# Patient Record
Sex: Male | Born: 1950
Health system: Southern US, Community
[De-identification: ages and names within clinical notes are randomized; demographics above are authoritative.]

## PROBLEM LIST (undated history)

## (undated) DIAGNOSIS — I2510 Atherosclerotic heart disease of native coronary artery without angina pectoris: Secondary | ICD-10-CM

## (undated) DIAGNOSIS — Z9989 Dependence on other enabling machines and devices: Secondary | ICD-10-CM

## (undated) DIAGNOSIS — G4733 Obstructive sleep apnea (adult) (pediatric): Secondary | ICD-10-CM

## (undated) DIAGNOSIS — E785 Hyperlipidemia, unspecified: Secondary | ICD-10-CM

## (undated) DIAGNOSIS — I219 Acute myocardial infarction, unspecified: Secondary | ICD-10-CM

## (undated) DIAGNOSIS — G4730 Sleep apnea, unspecified: Secondary | ICD-10-CM

## (undated) DIAGNOSIS — I252 Old myocardial infarction: Secondary | ICD-10-CM

## (undated) HISTORY — DX: Old myocardial infarction: I25.2

## (undated) HISTORY — DX: Sleep apnea, unspecified: G47.30

## (undated) HISTORY — DX: Obstructive sleep apnea (adult) (pediatric): G47.33

## (undated) HISTORY — DX: Acute myocardial infarction, unspecified: I21.9

## (undated) HISTORY — PX: ANKLE FRACTURE SURGERY: SHX122

## (undated) HISTORY — DX: Dependence on other enabling machines and devices: Z99.89

## (undated) HISTORY — DX: Atherosclerotic heart disease of native coronary artery without angina pectoris: I25.10

## (undated) HISTORY — DX: Hyperlipidemia, unspecified: E78.5

---

## 1988-11-25 HISTORY — PX: CORONARY/GRAFT ACUTE MI REVASCULARIZATION: CATH118305

## 1996-11-25 DIAGNOSIS — I2510 Atherosclerotic heart disease of native coronary artery without angina pectoris: Secondary | ICD-10-CM

## 1996-11-25 DIAGNOSIS — I252 Old myocardial infarction: Secondary | ICD-10-CM

## 1996-11-25 HISTORY — DX: Old myocardial infarction: I25.2

## 1996-11-25 HISTORY — DX: Atherosclerotic heart disease of native coronary artery without angina pectoris: I25.10

## 2015-03-29 ENCOUNTER — Encounter: Payer: Self-pay | Admitting: Neurology

## 2016-03-07 DIAGNOSIS — M79641 Pain in right hand: Secondary | ICD-10-CM | POA: Insufficient documentation

## 2016-03-07 DIAGNOSIS — E785 Hyperlipidemia, unspecified: Secondary | ICD-10-CM | POA: Insufficient documentation

## 2016-03-07 DIAGNOSIS — I471 Supraventricular tachycardia, unspecified: Secondary | ICD-10-CM | POA: Insufficient documentation

## 2016-03-07 DIAGNOSIS — I2510 Atherosclerotic heart disease of native coronary artery without angina pectoris: Secondary | ICD-10-CM | POA: Insufficient documentation

## 2017-03-28 DIAGNOSIS — G8929 Other chronic pain: Secondary | ICD-10-CM | POA: Insufficient documentation

## 2017-03-28 DIAGNOSIS — I252 Old myocardial infarction: Secondary | ICD-10-CM | POA: Insufficient documentation

## 2017-03-28 DIAGNOSIS — D649 Anemia, unspecified: Secondary | ICD-10-CM | POA: Insufficient documentation

## 2017-07-21 DIAGNOSIS — L2084 Intrinsic (allergic) eczema: Secondary | ICD-10-CM | POA: Insufficient documentation

## 2017-07-21 DIAGNOSIS — B360 Pityriasis versicolor: Secondary | ICD-10-CM | POA: Insufficient documentation

## 2017-09-17 HISTORY — PX: OTHER SURGICAL HISTORY: SHX169

## 2017-09-23 DIAGNOSIS — M545 Low back pain, unspecified: Secondary | ICD-10-CM | POA: Insufficient documentation

## 2018-06-29 ENCOUNTER — Ambulatory Visit: Payer: Self-pay | Admitting: Family Medicine

## 2018-07-21 ENCOUNTER — Other Ambulatory Visit: Payer: Self-pay

## 2018-07-21 ENCOUNTER — Encounter: Payer: Self-pay | Admitting: Family Medicine

## 2018-07-21 ENCOUNTER — Ambulatory Visit (INDEPENDENT_AMBULATORY_CARE_PROVIDER_SITE_OTHER): Payer: Medicare Other | Admitting: Family Medicine

## 2018-07-21 VITALS — BP 119/75 | HR 67 | Temp 98.4°F | Ht 70.5 in | Wt 325.2 lb

## 2018-07-21 DIAGNOSIS — E785 Hyperlipidemia, unspecified: Secondary | ICD-10-CM | POA: Diagnosis not present

## 2018-07-21 DIAGNOSIS — I2510 Atherosclerotic heart disease of native coronary artery without angina pectoris: Secondary | ICD-10-CM

## 2018-07-21 DIAGNOSIS — Z9989 Dependence on other enabling machines and devices: Secondary | ICD-10-CM | POA: Diagnosis not present

## 2018-07-21 DIAGNOSIS — G4733 Obstructive sleep apnea (adult) (pediatric): Secondary | ICD-10-CM | POA: Diagnosis not present

## 2018-07-21 DIAGNOSIS — M791 Myalgia, unspecified site: Secondary | ICD-10-CM

## 2018-07-21 MED ORDER — BISOPROLOL FUMARATE 5 MG PO TABS: 2.5000 mg | ORAL_TABLET | Freq: Every day | ORAL | 1 refills | Status: DC

## 2018-07-21 MED ORDER — ROSUVASTATIN CALCIUM 20 MG PO TABS: 20.0000 mg | ORAL_TABLET | Freq: Every day | ORAL | 1 refills | Status: DC

## 2018-07-21 NOTE — Progress Notes (Signed)
Subjective:  By signing my name below, I, Bruce Williams, attest that this documentation has been prepared under the direction and in the presence of Bruce Agreste, MD Electronically Signed: Ladene Artist, ED Scribe 07/21/2018 at 10:08 AM.   Patient ID: Bruce Williams, male    DOB: [birth year], 67 y.o.   MRN: [id number]  Chief Complaint  Patient presents with  . Establish Care    giving history. informed DOT will have to be done at another visit   HPI Bruce Williams is a 67 y.o. male who presents to Primary Care at [facility] to establish care. New pt to our office. H/o OSA on cpap, hyperlipidemia, CAD, MI, stent in 1998. Cardiologist Dr. Wyline Copas in [facility]. Prev PCP Dr. Jeralene Huff. Pt is fasting at this visit.  CAD Zebeta 5 mg qd. - Last saw cardiology in Oct. Pt requests a referral to a [facility] cardiologist since his DOT expires on 10/14. Pt denies h/o HTN or h/o angina.  Hyperlipidemia Crestor 20 mg qd. Recent lipid panel 06/2017 LDL 61, HDL 48, trig 63, total 121. - Pt has noticed myalgias and cramps, worsened by the end of the day. States he did not have myalgias while on simvastatin.  Pt also requests a referral to pulm or neuro for his cpap. Denies h/o COPD.  There are no active problems to display for this patient.  No past medical history on file.  No Known Allergies Prior to Admission medications   Not on File   Social History   Socioeconomic History  . Marital status: Married    Spouse name: Not on file  . Number of children: Not on file  . Years of education: Not on file  . Highest education level: Not on file  Occupational History  . Not on file  Social Needs  . Financial resource strain: Not on file  . Food insecurity:    Worry: Not on file    Inability: Not on file  . Transportation needs:    Medical: Not on file    Non-medical: Not on file  Tobacco Use  . Smoking status: Former Smoker    Last attempt to quit: 07/21/2013    Years since quitting: 5.0  .  Smokeless tobacco: Never Used  . Tobacco comment: currenty vapes (tabaco)  Substance and Sexual Activity  . Alcohol use: Not on file  . Drug use: Not on file  . Sexual activity: Not on file  Lifestyle  . Physical activity:    Days per week: Not on file    Minutes per session: Not on file  . Stress: Not on file  Relationships  . Social connections:    Talks on phone: Not on file    Gets together: Not on file    Attends religious service: Not on file    Active member of club or organization: Not on file    Attends meetings of clubs or organizations: Not on file    Relationship status: Not on file  . Intimate partner violence:    Fear of current or ex partner: Not on file    Emotionally abused: Not on file    Physically abused: Not on file    Forced sexual activity: Not on file  Other Topics Concern  . Not on file  Social History Narrative  . Not on file   Review of Systems  Cardiovascular: Negative for chest pain.  Musculoskeletal: Positive for myalgias.      Objective:   Physical Exam  Constitutional: He is oriented to person, place, and time. He appears well-developed and well-nourished.  HENT:  Head: Normocephalic and atraumatic.  Eyes: Pupils are equal, round, and reactive to light. EOM are normal.  Neck: No JVD present. Carotid bruit is not present.  Cardiovascular: Normal rate, regular rhythm and normal heart sounds.  No murmur heard. Pulmonary/Chest: Effort normal and breath sounds normal. He has no rales.  Musculoskeletal: He exhibits edema.  1+ pedal edema  Neurological: He is alert and oriented to person, place, and time.  Skin: Skin is warm and dry.  Psychiatric: He has a normal mood and affect.  Vitals reviewed.  Vitals:   07/21/18 0940  BP: 119/75  Pulse: 67  Temp: 98.4 F (36.9 C)  TempSrc: Oral  SpO2: 91%  Weight: (!) 325 lb 3.2 oz (147.5 kg)  Height: 5' 10.5" (1.791 m)      Assessment & Plan:   Bruce Williams is a 67 y.o. male Coronary  artery disease involving native heart without angina pectoris, unspecified vessel or lesion type - Plan: Ambulatory referral to Cardiology, bisoprolol (ZEBETA) 5 MG tablet, rosuvastatin (CRESTOR) 20 MG tablet  -Asymptomatic, tolerating current meds, continue follow-up with cardiology.  OSA on CPAP - Plan: Ambulatory referral to Sleep Studies  -Tolerating CPAP, will refer to sleep specialist at his request for ongoing care.  No changes for now.  Myalgia - Plan: [facility] Hyperlipidemia, unspecified hyperlipidemia type - Plan: Comprehensive metabolic panel, Lipid panel, rosuvastatin (CRESTOR) 20 MG tablet  -Currently on Crestor with some intermittent myalgias.  Will check CPK, lipid panel, CMP.  Option of co-Q10 to see if that helps of myalgias, or temporary lower dose of Crestor to see if that is better tolerated.  RTC precautions if worsening.  Plans to return for DOT physical, recommendations discussed regarding history of CAD  Meds ordered this encounter  Medications  . bisoprolol (ZEBETA) 5 MG tablet    Sig: Take 0.5 tablets (2.5 mg total) by mouth daily.    Dispense:  90 tablet    Refill:  1  . rosuvastatin (CRESTOR) 20 MG tablet    Sig: Take 1 tablet (20 mg total) by mouth daily.    Dispense:  90 tablet    Refill:  1   Patient Instructions     For DOT - I will need recent compliance report from your CPAP. Additionally you will need to be seen by cardiology. I have placed referral to cardiology and sleep specialist.   I will check lipids today and muscle enzyme test. You can ry CoQ10 to see if that helps with muscle aches or take 1/2 of the crestor to see if that is better tolerated for now.   Return to the clinic or go to the nearest emergency room if any of your symptoms worsen or new symptoms occur.   If you have lab work done today you will be contacted with your lab results within the next 2 weeks.  If you have not heard from [place] then please contact us. The fastest way to get your  results is to register for My Chart.   IF you received an x-ray today, you will receive an invoice from [facility]. Please contact [facility] at [phone] with questions or concerns regarding your invoice.   IF you received labwork today, you will receive an invoice from [facility]. Please contact [facility] at [phone] with questions or concerns regarding your invoice.   Our billing staff will not be able to assist you  with questions regarding bills from these companies.  You will be contacted with the lab results as soon as they are available. The fastest way to get your results is to activate your My Chart account. Instructions are located on the last page of this paperwork. If you have not heard from [place] regarding the results in 2 weeks, please contact this office.      I personally performed the services described in this documentation, which was scribed in my presence. The recorded information has been reviewed and considered for accuracy and completeness, addended by me as needed, and agree with information above.  Signed,   Merri Ray, MD Primary [facility] at [place].  07/25/18 3:12 PM

## 2018-07-21 NOTE — Patient Instructions (Addendum)
   For DOT - I will need recent compliance report from your CPAP. Additionally you will need to be seen by cardiology. I have placed referral to cardiology and sleep specialist.   I will check lipids today and muscle enzyme test. You can ry CoQ10 to see if that helps with muscle aches or take 1/2 of the crestor to see if that is better tolerated for now.   Return to the clinic or go to the nearest emergency room if any of your symptoms worsen or new symptoms occur.   If you have lab work done today you will be contacted with your lab results within the next 2 weeks.  If you have not heard from [place] then please contact us. The fastest way to get your results is to register for My Chart.   IF you received an x-ray today, you will receive an invoice from [facility]. Please contact [facility] at [phone] with questions or concerns regarding your invoice.   IF you received labwork today, you will receive an invoice from [facility]. Please contact [facility] at [phone] with questions or concerns regarding your invoice.   Our billing staff will not be able to assist you with questions regarding bills from these companies.  You will be contacted with the lab results as soon as they are available. The fastest way to get your results is to activate your My Chart account. Instructions are located on the last page of this paperwork. If you have not heard from [place] regarding the results in 2 weeks, please contact this office.

## 2018-07-22 LAB — LIPID PANEL
CHOLESTEROL TOTAL: 140 mg/dL (ref 100–199)
Chol/HDL Ratio: 2.2 ratio (ref 0.0–5.0)
HDL: 63 mg/dL (ref >39)
LDL CALC: 65 mg/dL (ref 0–99)
Triglycerides: 60 mg/dL (ref 0–149)
VLDL Cholesterol Cal: 12 mg/dL (ref 5–40)

## 2018-07-22 LAB — COMPREHENSIVE METABOLIC PANEL
A/G RATIO: 1.4 (ref 1.2–2.2)
ALBUMIN: 4.2 g/dL (ref 3.6–4.8)
ALK PHOS: 46 IU/L (ref 39–117)
ALT: 11 IU/L (ref 0–44)
AST: 12 IU/L (ref 0–40)
BILIRUBIN TOTAL: 0.4 mg/dL (ref 0.0–1.2)
BUN / CREAT RATIO: 13 (ref 10–24)
BUN: 13 mg/dL (ref 8–27)
CHLORIDE: 103 mmol/L (ref 96–106)
CO2: 20 mmol/L (ref 20–29)
Calcium: 9.6 mg/dL (ref 8.6–10.2)
Creatinine, Ser: 0.97 mg/dL (ref 0.76–1.27)
GFR calc Af Amer: 94 mL/min/{1.73_m2} (ref >59)
GFR calc non Af Amer: 82 mL/min/{1.73_m2} (ref >59)
GLOBULIN, TOTAL: 3.1 g/dL (ref 1.5–4.5)
Glucose: 86 mg/dL (ref 65–99)
Potassium: 4.8 mmol/L (ref 3.5–5.2)
SODIUM: 140 mmol/L (ref 134–144)
Total Protein: 7.3 g/dL (ref 6.0–8.5)

## 2018-07-22 LAB — CK: Total CK: 66 U/L (ref 24–204)

## 2018-08-04 ENCOUNTER — Encounter: Payer: Self-pay | Admitting: Neurology

## 2018-08-04 ENCOUNTER — Ambulatory Visit (INDEPENDENT_AMBULATORY_CARE_PROVIDER_SITE_OTHER): Payer: Medicare Other | Admitting: Neurology

## 2018-08-04 VITALS — BP 128/79 | HR 73 | Ht 71.0 in | Wt 329.0 lb

## 2018-08-04 DIAGNOSIS — Z6841 Body Mass Index (BMI) 40.0 and over, adult: Secondary | ICD-10-CM | POA: Diagnosis not present

## 2018-08-04 DIAGNOSIS — Z9989 Dependence on other enabling machines and devices: Secondary | ICD-10-CM | POA: Diagnosis not present

## 2018-08-04 DIAGNOSIS — E66813 Obesity, class 3: Secondary | ICD-10-CM

## 2018-08-04 DIAGNOSIS — I2510 Atherosclerotic heart disease of native coronary artery without angina pectoris: Secondary | ICD-10-CM | POA: Diagnosis not present

## 2018-08-04 DIAGNOSIS — G4733 Obstructive sleep apnea (adult) (pediatric): Secondary | ICD-10-CM | POA: Diagnosis not present

## 2018-08-04 NOTE — Progress Notes (Signed)
SLEEP MEDICINE CLINIC   Provider:  Larey Seat, M.D.   Primary Care Physician:  [name], MD   Referring Provider: [name], MD    Chief Complaint  Patient presents with  . New Patient (Initial Visit)    pt alone, rm 10. pt is a Proofreader, he is here to establish care with CPAP and get a paper that proves to DOT that he is using the machine. DME is [facility] and pt is due for supplies. sleep study 02/2015 and set up on CPAP 03/2015    HPI:  Bruce Williams is a 67 y.o. male patient, seen here in a referral from Dr. 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 He was first diagnosed in 2002 at [facility] [place] in hospital sleep lab.  He learned that he had sleep apnea and was treated with CPAP ever since.  Once he moved to [place] he continued treatment here at [facility].  He has been using an AutoSet air sense 10 machine, and this was just issued to him in May 2016.  It is set between 5 and 15 cmH2O with 3 cm EPR, he is using on average 6 hours and 35 minutes of CPAP therapy nightly, 100% compliance by download and his residual AHI is 1.8.  He does however have high air leakage, the 95th percentile pressure is close to 14 cm which would mean that he could use a little bit higher pressure settings on his AutoSet.  He feels clinically that his CPAP therapy is sufficient he has not endorsed elevated sleepiness levels on the Epworth sleepiness score.  Today he endorses at 4 points, fatigue severity at 23 points, and the geriatric depression score was endorsed at 1 out of 15 points.  DOT related 90-day download shows equally 100% of use at time 99% by time over 4 hours, only one night to use the machine for 3-1/2 hours.   Average use of time 6 hours 26 minutes, same settings as above, residual AHI 2.0 with obstructive apneas being 0.5.  Similar air leak.  Chief complaint according to patient :He needs to transfer care and wanted to be in the [facility].   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  He works about 10 to 12 hours daily in his retirement job.  He just transitioned to Medicare.  Occasional he will spend the evening at church functions, the couple goes bowling, movies goes to the movies but he is not necessarily physically active. His bedtime is usually between 10 and 11 PM unless he has a very early morning assignment.  He usually has no trouble to initiate sleep, on occasion he will be early in bed because of the early morning assignments, but he usually sleeps well in a cool, quiet and dark bedroom.  He does not have nocturia.  He sleeps on either side, mostly mostly the left. He sleeps on only one pillow for head support in a nonadjustable bed.  He rises in the morning usually at 6 AM or 7 AM, and he usually feels rested at the time.  He averages 7 to 8 hours of nocturnal  sleep, most nights uninterrupted.  I have the pleasure of reviewing Bruce Williams's last sleep study which was a home sleep test performed at [facility] on [address] in [place] on Mar 30, 2015 by Dr. at bedtime Mohammed.  The AHI was 57.6, supine sleep position accentuated the AHI to 59.3, the majority was obstructive 28.5/h followed by centrals at 14.4/h.  Oxygen saturation low was 79%, duration of 31 minutes at or below 88%.  1 hour 49 minutes.  Pulse rate between 47 and 94 bpm apparently no arrhythmia was captured.  He was issued the auto set air sense 10 machine after this test.  I also have a pulmonology note available obstructive sleep apnea ordering AutoPap 5 through 15 cmH2O, risk factor BMI 45-49.9 in adult.   Based on these notes I should be able to transfer his CPAP care and supplies to a local DME.   Sleep medical history and family sleep history: CAD, MI in 1998 and stent [place] in [place]. - reports ongoing  irregular heart beats.  OSA affected his father, another air [employer] veteran. No sleep walking, enuresis , no night terrors.   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 The patient is married, has 3 adult children ( oldest 72 year old daughter with 3 children, 2 step children) , 5 grandchildren- former 70 year smoker- quit 5 years ago in 2014. Beer / wine - one glass a week. Caffeine ; 2 cups of coffee a day- no sodas, not often iced tea.     Review of Systems: Out of a complete 14 system review, the patient complains of only the following symptoms, and all other reviewed systems are negative.   Snoring, poor sleep if not on CPAP. Weight gain.  Epworth score: 4/ 24 points , Fatigue severity score: 23/ 63 points  , depression score 1/ 15    Social History   Socioeconomic History  . Marital status: Married    Spouse name: Not on file  . Number of children: Not on file  . Years of education: Not on file  . Highest education level: Not on file  Occupational History  . Not on file  Social Needs  . Financial resource strain: Not on file  . Food insecurity:    Worry: Not on file    Inability: Not on file  . Transportation needs:    Medical: Not on file    Non-medical: Not on file  Tobacco Use  . Smoking status: Former Smoker    Last attempt to quit: 07/21/2013    Years since quitting: 5.0  . Smokeless tobacco: Never Used  . Tobacco comment: currenty vapes (tabaco)  Substance and Sexual Activity  . Alcohol use: Not on file  . Drug use: Not on file  . Sexual activity: Not on file  Lifestyle  . Physical activity:    Days per week: Not on file    Minutes per session: Not on file  . Stress: Not on file  Relationships  . Social connections:    Talks on phone:  Not on file    Gets together: Not on file    Attends religious service: Not on file    Active member of club or organization: Not on file    Attends meetings of clubs or organizations: Not on file    Relationship status: Not on file  . Intimate partner violence:    Fear of current or ex partner: Not on  file    Emotionally abused: Not on file    Physically abused: Not on file    Forced sexual activity: Not on file  Other Topics Concern  . Not on file  Social History Narrative  . Not on file    No family history on file.  No past medical history on file.  No past surgical history on file.  Current Outpatient Medications  Medication Sig Dispense Refill  . aspirin EC 81 MG tablet Take 81 mg by mouth daily.    . bisoprolol (ZEBETA) 5 MG tablet Take 0.5 tablets (2.5 mg total) by mouth daily. 90 tablet 1  . Multiple Vitamin (MULTI-VITAMINS) TABS Take by mouth.    . rosuvastatin (CRESTOR) 20 MG tablet Take 1 tablet (20 mg total) by mouth daily. 90 tablet 1  . triamcinolone cream (KENALOG) 0.1 % Apply to patches of dry skin 2 times daily as needed     No current facility-administered medications for this visit.     Allergies as of 08/04/2018  . (No Known Allergies)    Vitals: BP 128/79   Pulse 73   Ht 5\' 11"  (1.803 m)   Wt (!) 329 lb (149.2 kg)   BMI 45.89 kg/m  Last Weight:  Wt Readings from Last 1 Encounters:  08/04/18 (!) 329 lb (149.2 kg)   ZOX:WRUE mass index is 45.89 kg/m.     Last Height:   Ht Readings from Last 1 Encounters:  08/04/18 5\' 11"  (1.803 m)    Physical exam:  General: The patient is awake, alert and appears not in acute distress. The patient is well groomed. Dentures full -lower Head: Normocephalic, atraumatic. Neck is supple. Mallampati 3-  very elongated uvula.   neck circumference:20.5 . Nasal airflow patent . Retrognathia is not seen.  Cardiovascular:  Regular rate and rhythm , without  murmurs or carotid bruit, and without distended neck  veins. Respiratory: Lungs are clear to auscultation. Skin:  Without evidence of edema, or rash Trunk: BMI is 46. The patient's posture is erect.   Neurologic exam : The patient is awake and alert, oriented to place and time.   Attention span & concentration ability appears normal.  Speech is fluent,  without  dysarthria, dysphonia or aphasia.  Mood and affect are appropriate.  Cranial nerves: Intact taste and smell- Pupils are equal and briskly reactive to light. Funduscopic exam without evidence of pallor or edema, early cataracts forming .  Extraocular movements  in vertical and horizontal planes intact and without nystagmus. Visual fields by finger perimetry are intact. Hearing to finger rub reduced. Facial sensation intact to fine touch. Facial motor strength is symmetric and tongue and uvula move midline. Shoulder shrug was symmetrical.  Motor exam:  Normal tone, muscle bulk and symmetric strength in all extremities. Full grip strength  Sensory:  Fine touch, pinprick and vibration were tested in all extremities. Proprioception tested in the upper extremities was normal. Coordination:  Finger-to-nose maneuver  normal without evidence of ataxia, dysmetria or tremor. Gait and station: Patient walks without assistive device and is able unassisted to climb up to the exam table. Strength within normal limits. Stance is stable and normal. Turns with 3 Steps. Deep tendon reflexes: in the  upper and lower extremities are symmetric and intact.   Assessment:  After physical and neurologic examination, review of laboratory studies,  Personal review of imaging studies, reports of other /same  Imaging studies, results of polysomnography and / or neurophysiology testing and pre-existing records as  far as provided in visit., my assessment is   1) Transfer of CPAP care to [facility] health and to local DME-   2)  He is using nasal Resmed Air fit P10- pillows in large size, s it slips a lot , creating air  leaks- he wants to stay with nasal pillows.   3)  No change in setting.    The patient was advised of the nature of the diagnosed disorder , the treatment options and the  risks for general health and wellness arising from not treating the condition.   I spent more than 45 minutes of face to face time with the patient.  Greater than 50% of time was spent in counseling and coordination of care. We have discussed the diagnosis and differential and I answered the patient's questions.    Plan:  Treatment plan and additional workup :  Bruce Williams has been a compliant CPAP user for well over 15 years, and he has been followed at [facility] in [facility] for the last 3 or 5 years.  That needs to be no change in settings, his current machine is 58.65 years old but I like for him to have a new nasal pillow fitted.  The ResMed air fit P 10 is very easily sliding off and he may do better with an N 30 I. I will follow him yearly until he needs a new machine.     Larey Seat, MD 03/08/2394, 3:20 AM  Certified in Neurology by ABPN Certified in [place] by [facility] [address], [place], [place] [address]

## 2018-08-31 ENCOUNTER — Other Ambulatory Visit: Payer: Self-pay

## 2018-08-31 ENCOUNTER — Encounter: Payer: Self-pay | Admitting: Family Medicine

## 2018-08-31 ENCOUNTER — Encounter: Payer: Self-pay | Admitting: Cardiology

## 2018-08-31 ENCOUNTER — Ambulatory Visit (INDEPENDENT_AMBULATORY_CARE_PROVIDER_SITE_OTHER): Payer: Medicare Other | Admitting: Cardiology

## 2018-08-31 ENCOUNTER — Ambulatory Visit (INDEPENDENT_AMBULATORY_CARE_PROVIDER_SITE_OTHER): Payer: Medicare Other | Admitting: Family Medicine

## 2018-08-31 VITALS — BP 133/69 | HR 78 | Temp 98.0°F | Resp 16 | Ht 70.47 in | Wt 329.0 lb

## 2018-08-31 VITALS — BP 127/81 | HR 80 | Ht 71.0 in | Wt 328.8 lb

## 2018-08-31 DIAGNOSIS — I499 Cardiac arrhythmia, unspecified: Secondary | ICD-10-CM

## 2018-08-31 DIAGNOSIS — E785 Hyperlipidemia, unspecified: Secondary | ICD-10-CM | POA: Diagnosis not present

## 2018-08-31 DIAGNOSIS — Z0000 Encounter for general adult medical examination without abnormal findings: Secondary | ICD-10-CM | POA: Diagnosis not present

## 2018-08-31 DIAGNOSIS — I2510 Atherosclerotic heart disease of native coronary artery without angina pectoris: Secondary | ICD-10-CM | POA: Diagnosis not present

## 2018-08-31 DIAGNOSIS — Z0289 Encounter for other administrative examinations: Secondary | ICD-10-CM

## 2018-08-31 DIAGNOSIS — I10 Essential (primary) hypertension: Secondary | ICD-10-CM | POA: Insufficient documentation

## 2018-08-31 NOTE — Progress Notes (Signed)
Subjective:  By signing my name below, I, Moises Blood, attest that this documentation has been prepared under the direction and in the presence of Merri Ray, MD. Electronically Signed: Moises Blood, [place]. 08/31/2018 , 2:16 PM .  Patient was seen in Room 10 .   Patient ID: Bruce Williams, male    DOB: January 12, 1951, 67 y.o.   MRN: [id number] Chief Complaint  Patient presents with  . Medicare Wellness   HPI  Bruce Williams is a 67 y.o. male Here for annual physical. He has a history of CAD, HTN, hyperlipidemia, and OSA on CPAP. He first saw me in Aug to establish care.   He is currently still vaping.   History of CAD He had MI with stent in 1998. His cardiologist was previously in [facility] but referred locally. He is on Zebeta 5 mg qd and Crestor for statin. We did discuss CoQ-10 supplements to help with myalgias last visit.   He saw cardiology, Dr. Ellyn Hack, this morning. He denies having stress test done; last stress test done a year ago. 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 He hasn't taken the CoQ-10 supplements yet, but looking into it. He had EKG done yesterday at cardiologist visit.   OSA on CPAP He was referred to sleep specialist at last visit, and was continued on CPAP.   He also saw sleep specialist, Dr. Brett Fairy, on Sept 10th.   Cancer Screening Colonoscopy: negative fecal occult testing 04/02/17; states he had colonoscopy done 2 years ago at [facility]; few polyps removed and repeat every 5 years.   Prostate cancer screening: history of slightly enlarged prostate. Reports some screening done by previous doctor, Dr. Jeralene Huff. He wants to defer prostate screening today.   Immunizations Immunization History  Administered Date(s) Administered  . Influenza,inj,Quad PF,6+ Mos 09/26/2016  . Influenza-Unspecified 09/13/2015   Shingles: he declines shingles vaccination due to cost; checked about a year and half ago was about $400.  Flu shot:  he's declining flu shot today, as he's become "sicker than a dog" each time he receives a flu shot in the past.  Pneumovax: he's declining pneumovax today.   Hep C and HIV screening He states he had Hep C and HIV screening done 4 years ago with Dr. Jeralene Huff.   Fall screening He denies any falls in the past year.   Depression Depression screen [facility] 2/9 08/31/2018 07/21/2018  Decreased Interest 0 0  Down, Depressed, Hopeless 0 0  PHQ - 2 Score 0 0     Vision  Visual Acuity Screening   Right eye Left eye Both eyes  Without correction:     With correction: '20/20 20/20 20/20 '   Last seen eye doctor a year ago.   Dentist He denies having a dentist; still have some natural teeth.    Exercise He walks for exercise daily, where he walks a lot for work; generally 15-20 minutes a day.    Functional Status Survey Is the patient deaf or have difficulty hearing?: No Does the patient have difficulty seeing, even when wearing glasses/contacts?: No Does the patient have difficulty concentrating, remembering, or making decisions?: No Does the patient have difficulty walking or climbing stairs?: No Does the patient have difficulty dressing or bathing?: No Does the patient have difficulty doing errands alone such as visiting a doctor's office or shopping?: No  Mental Status Survey 6CIT Screen 08/31/2018  What Year? 0 points  What month? 0 points  What time? 0 points  Count back from 20 0 points  Months in reverse 0 points  Repeat phrase 0 points  Total Score 0    Advanced Directives He doesn't have advanced directives, and information provided today.    Patient Active Problem List   Diagnosis Date Noted  . Essential hypertension 08/31/2018  . Irregular heart beats 08/31/2018  . Encounter for physical examination related to employment 08/31/2018  . Coronary artery disease involving native heart without angina pectoris   . Hyperlipidemia with target LDL less than 70    Past Medical  History:  Diagnosis Date  . Heart disease   . High cholesterol    Past Surgical History:  Procedure Laterality Date  . ANKLE FRACTURE SURGERY Right 1980's   No Known Allergies Prior to Admission medications   Medication Sig Start Date End Date Taking? Authorizing Provider  aspirin EC 81 MG tablet Take 81 mg by mouth daily.    [provider]  bisoprolol (ZEBETA) 5 MG tablet Take 0.5 tablets (2.5 mg total) by mouth daily. 07/21/18   [name], MD  Multiple Vitamin (MULTI-VITAMINS) TABS Take by mouth.    [provider]  rosuvastatin (CRESTOR) 20 MG tablet Take 1 tablet (20 mg total) by mouth daily. 07/21/18   [name], MD  triamcinolone cream (KENALOG) 0.1 % Apply to patches of dry skin 2 times daily as needed 07/21/17   [provider]   Social History   Socioeconomic History  . Marital status: Married    Spouse name: Not on file  . Number of children: Not on file  . Years of education: Not on file  . Highest education level: Not on file  Occupational History  . Not on file  Social Needs  . Financial resource strain: Not on file  . Food insecurity:    Worry: Not on file    Inability: Not on file  . Transportation needs:    Medical: Not on file    Non-medical: Not on file  Tobacco Use  . Smoking status: Former Smoker    Last attempt to quit: 07/21/2013    Years since quitting: 5.1  . Smokeless tobacco: Never Used  . Tobacco comment: currenty vapes (tabaco)  Substance and Sexual Activity  . Alcohol use: Not Currently  . Drug use: Never  . Sexual activity: Not on file  Lifestyle  . Physical activity:    Days per week: Not on file    Minutes per session: Not on file  . Stress: Not on file  Relationships  . Social connections:    Talks on phone: Not on file    Gets together: Not on file    Attends religious service: Not on file    Active member of club or organization: Not on file    Attends meetings of clubs or  organizations: Not on file    Relationship status: Not on file  . Intimate partner violence:    Fear of current or ex partner: Not on file    Emotionally abused: Not on file    Physically abused: Not on file    Forced sexual activity: Not on file  Other Topics Concern  . Not on file  Social History Narrative  . Not on file    Review of Systems 13 point ROS - negative     Objective:   Physical Exam  Constitutional: He is oriented to person, place, and time. He appears well-developed and well-nourished.  HENT:  Head: Normocephalic and  atraumatic.  Right Ear: External ear normal.  Left Ear: External ear normal.  Mouth/Throat: Oropharynx is clear and moist.  Eyes: Pupils are equal, round, and reactive to light. Conjunctivae and EOM are normal.  Neck: Normal range of motion. Neck supple. No thyromegaly present.  Cardiovascular: Normal rate, regular rhythm, normal heart sounds and intact distal pulses.  Pulmonary/Chest: Effort normal and breath sounds normal. No respiratory distress. He has no wheezes.  Abdominal: Soft. He exhibits no distension. There is no tenderness. Hernia confirmed negative in the right inguinal area and confirmed negative in the left inguinal area.  Genitourinary: Prostate normal.  Musculoskeletal: Normal range of motion. He exhibits edema (1-2+ pedal edema to the upper 3rd of tibia). He exhibits no tenderness.  Lymphadenopathy:    He has no cervical adenopathy.  Neurological: He is alert and oriented to person, place, and time. He has normal reflexes.  Skin: Skin is warm and dry.  Psychiatric: He has a normal mood and affect. His behavior is normal.  Vitals reviewed.    Vitals:   08/31/18 1327  BP: 133/69  Pulse: 78  Resp: 16  Temp: 98 F (36.7 C)  TempSrc: Oral  SpO2: 95%  Weight: (!) 329 lb (149.2 kg)  Height: 5' 10.47" (1.79 m)       Assessment & Plan:   Bruce Williams is a 67 y.o. male Encounter for Medicare annual wellness exam  -  -  anticipatory guidance as below in AVS, screening labs if needed. Health maintenance items as above in HPI discussed/recommended as applicable.   - no concerning responses on depression, fall, or functional status screening. Any positive responses noted as above. Advanced directives discussed as in CHL.   - will check into colonoscopy and hep C, HIV tests.   -declined flu/pneumonia vaccination at this time.   No orders of the defined types were placed in this encounter.  Patient Instructions   We will obtain record of last colonoscopy, Hep C and HIV testing.   Let me know if you change your mind about flu, pneumonia vaccines.  Check with your pharmacy to see if shingles vaccine may be less costly.   Schedule dentist appointment as soon as possible.  [facility]  Address: [address], [place], [place] [address]  [email or web address]  Phone: [phone]  [facility]  Address: [address], [place], [place] [address] Phone: [phone]   Preventive Care 54 Years and Older, Male Preventive care refers to lifestyle choices and visits with your health care provider that can promote health and wellness. What does preventive care include?  A yearly physical exam. This is also called an annual well check.  Dental exams once or twice a year.  Routine eye exams. Ask your health care provider how often you should have your eyes checked.  Personal lifestyle choices, including: ? Daily care of your teeth and gums. ? Regular physical activity. ? Eating a healthy diet. ? Avoiding tobacco and drug use. ? Limiting alcohol use. ? Practicing safe sex. ? Taking low doses of aspirin every day. ? Taking vitamin and mineral supplements as recommended by your health care provider. What happens during an annual well check? The services and screenings done by your health care provider during your annual well check will depend on your age, overall health,  lifestyle risk factors, and family history of disease. Counseling Your health care provider may ask you questions about your:  Alcohol use.  Tobacco use.  Drug use.  Emotional well-being.  Home and relationship well-being.  Sexual activity.  Eating habits.  History of falls.  Memory and ability to understand (cognition).  Work and work Statistician.  Screening You may have the following tests or measurements:  Height, weight, and BMI.  Blood pressure.  Lipid and cholesterol levels. These may be checked every 5 years, or more frequently if you are over 17 years old.  Skin check.  Lung cancer screening. You may have this screening every year starting at age 61 if you have a 30-pack-year history of smoking and currently smoke or have quit within the past 15 years.  Fecal occult blood test (FOBT) of the stool. You may have this test every year starting at age 77.  Flexible sigmoidoscopy or colonoscopy. You may have a sigmoidoscopy every 5 years or a colonoscopy every 10 years starting at age 57.  Prostate cancer screening. Recommendations will vary depending on your family history and other risks.  Hepatitis C blood test.  Hepatitis B blood test.  Sexually transmitted disease (STD) testing.  Diabetes screening. This is done by checking your blood sugar (glucose) after you have not eaten for a while (fasting). You may have this done every 1-3 years.  Abdominal aortic aneurysm (AAA) screening. You may need this if you are a current or former smoker.  Osteoporosis. You may be screened starting at age 53 if you are at high risk.  Talk with your health care provider about your test results, treatment options, and if necessary, the need for more tests. Vaccines Your health care provider may recommend certain vaccines, such as:  Influenza vaccine. This is recommended every year.  Tetanus, diphtheria, and acellular pertussis (Tdap, Td) vaccine. You may need a Td  booster every 10 years.  Varicella vaccine. You may need this if you have not been vaccinated.  Zoster vaccine. You may need this after age 61.  Measles, mumps, and rubella (MMR) vaccine. You may need at least one dose of MMR if you were born in 1957 or later. You may also need a second dose.  Pneumococcal 13-valent conjugate (PCV13) vaccine. One dose is recommended after age 61.  Pneumococcal polysaccharide (PPSV23) vaccine. One dose is recommended after age 4.  Meningococcal vaccine. You may need this if you have certain conditions.  Hepatitis A vaccine. You may need this if you have certain conditions or if you travel or work in places where you may be exposed to hepatitis A.  Hepatitis B vaccine. You may need this if you have certain conditions or if you travel or work in places where you may be exposed to hepatitis B.  Haemophilus influenzae type b (Hib) vaccine. You may need this if you have certain risk factors.  Talk to your health care provider about which screenings and vaccines you need and how often you need them. This information is not intended to replace advice given to you by your health care provider. Make sure you discuss any questions you have with your health care provider. Document Released: 12/08/2015 Document Revised: 07/31/2016 Document Reviewed: 09/12/2015 Elsevier Interactive Patient Education  [employer].    If you have lab work done today you will be contacted with your lab results within the next 2 weeks.  If you have not heard from [place] then please contact us. The fastest way to get your results is to register for My Chart.   IF you received an x-ray today, you will receive an invoice from [facility]. Please contact  [facility] at [phone] with questions or concerns regarding your invoice.   IF you received labwork today, you will receive an invoice from [place]. Please contact [facility] at [phone] with questions or  concerns regarding your invoice.   Our billing staff will not be able to assist you with questions regarding bills from these companies.  You will be contacted with the lab results as soon as they are available. The fastest way to get your results is to activate your My Chart account. Instructions are located on the last page of this paperwork. If you have not heard from [place] regarding the results in 2 weeks, please contact this office.       I personally performed the services described in this documentation, which was scribed in my presence. The recorded information has been reviewed and considered for accuracy and completeness, addended by me as needed, and agree with information above.  Signed,   Merri Ray, MD [facility].  08/31/18 10:26 PM

## 2018-08-31 NOTE — Assessment & Plan Note (Signed)
Below 70 LDL on stable dose of statin. I suspect that with weight loss, his numbers would improve --> would continue to treat with current dose of Crestor, but eventually goal would be less than 50 for LDL.

## 2018-08-31 NOTE — Patient Instructions (Addendum)
Medication Instructions:   NOT NEEDED If you need a refill on your cardiac medications before your next appointment, please call your pharmacy.   Lab work: NOT NEEDED If you have labs (blood work) drawn today and your tests are completely normal, you will receive your results only by: Marland Kitchen MyChart Message (if you have MyChart) OR . A paper copy in the mail If you have any lab test that is abnormal or we need to change your treatment, we will call you to review the results.  Testing/Procedures: NOT NEEDED   Follow-Up: At [facility], you and your health needs are our priority.  As part of our continuing mission to provide you with exceptional heart care, we have created designated Provider Care Teams.  These Care Teams include your primary Cardiologist (physician) and Advanced Practice Providers (APPs -  Physician Assistants and Nurse Practitioners) who all work together to provide you with the care you need, when you need it. You will need a follow up appointment in 10 months (AUG 2020).  Please call our office 2 months in advance to schedule this appointment.  You may see Glenetta Hew, MD or one of the following Advanced Practice Providers on your designated Care Team:   Rosaria Ferries, PA-C . Jory Sims, DNP, ANP  Any Other Special Instructions Will Be Listed Below :.  PLEASE CONTACT OFFICE ONCE YOU SPEAK TO  D.OT.  EXAMINER - IF YOU NEEDED  TO HAVE  TESTING DONE. OUR OFFICE WILL SCHEDULE YOU FOR  EXERCISE TOLERANCE TEST

## 2018-08-31 NOTE — Assessment & Plan Note (Signed)
He says he has had history of "irregular heartbeats for years, but never said he had any arrhythmia.  He says the most of these irregular heartbeats are controlled with the low-dose bisoprolol.  No symptoms to suggest A. fib or any other arrhythmia.

## 2018-08-31 NOTE — Assessment & Plan Note (Signed)
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  I am not sure what the requirements would be, I think since he had a stress test done last year, he should be fine in the absence of symptoms and would not need to have another evaluation. 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  Since I would not clinically check a stress test, I will wait and see what the DOT physical requirements are.  He will let us know once he hears from the clinic but he is going to.  If he does need a stress test we can get that ordered.

## 2018-08-31 NOTE — Patient Instructions (Addendum)
We will obtain record of last colonoscopy, Hep C and HIV testing.   Let me know if you change your mind about flu, pneumonia vaccines.  Check with your pharmacy to see if shingles vaccine may be less costly.   Schedule dentist appointment as soon as possible.  [facility]  Address: [address], [place], [place] [address]  [email or web address]  Phone: [phone]  [facility] [place]  Address: [address], [place], [place] [address] Phone: [phone]   Preventive Care 65 Years and Older, Male Preventive care refers to lifestyle choices and visits with your health care provider that can promote health and wellness. What does preventive care include?  A yearly physical exam. This is also called an annual well check.  Dental exams once or twice a year.  Routine eye exams. Ask your health care provider how often you should have your eyes checked.  Personal lifestyle choices, including: ? Daily care of your teeth and gums. ? Regular physical activity. ? Eating a healthy diet. ? Avoiding tobacco and drug use. ? Limiting alcohol use. ? Practicing safe sex. ? Taking low doses of aspirin every day. ? Taking vitamin and mineral supplements as recommended by your health care provider. What happens during an annual well check? The services and screenings done by your health care provider during your annual well check will depend on your age, overall health, lifestyle risk factors, and family history of disease. Counseling Your health care provider may ask you questions about your:  Alcohol use.  Tobacco use.  Drug use.  Emotional well-being.  Home and relationship well-being.  Sexual activity.  Eating habits.  History of falls.  Memory and ability to understand (cognition).  Work and work Statistician.  Screening You may have the following tests or measurements:  Height, weight, and BMI.  Blood pressure.  Lipid and cholesterol  levels. These may be checked every 5 years, or more frequently if you are over 65 years old.  Skin check.  Lung cancer screening. You may have this screening every year starting at age 65 if you have a 30-pack-year history of smoking and currently smoke or have quit within the past 15 years.  Fecal occult blood test (FOBT) of the stool. You may have this test every year starting at age 55.  Flexible sigmoidoscopy or colonoscopy. You may have a sigmoidoscopy every 5 years or a colonoscopy every 10 years starting at age 65.  Prostate cancer screening. Recommendations will vary depending on your family history and other risks.  Hepatitis C blood test.  Hepatitis B blood test.  Sexually transmitted disease (STD) testing.  Diabetes screening. This is done by checking your blood sugar (glucose) after you have not eaten for a while (fasting). You may have this done every 1-3 years.  Abdominal aortic aneurysm (AAA) screening. You may need this if you are a current or former smoker.  Osteoporosis. You may be screened starting at age 65 if you are at high risk.  Talk with your health care provider about your test results, treatment options, and if necessary, the need for more tests. Vaccines Your health care provider may recommend certain vaccines, such as:  Influenza vaccine. This is recommended every year.  Tetanus, diphtheria, and acellular pertussis (Tdap, Td) vaccine. You may need a Td booster every 10 years.  Varicella vaccine. You may need this if you have not been vaccinated.  Zoster vaccine. You may need this after age 65.  Measles, mumps, and rubella (  MMR) vaccine. You may need at least one dose of MMR if you were born in 1957 or later. You may also need a second dose.  Pneumococcal 13-valent conjugate (PCV13) vaccine. One dose is recommended after age 65.  Pneumococcal polysaccharide (PPSV23) vaccine. One dose is recommended after age 65.  Meningococcal vaccine. You may  need this if you have certain conditions.  Hepatitis A vaccine. You may need this if you have certain conditions or if you travel or work in places where you may be exposed to hepatitis A.  Hepatitis B vaccine. You may need this if you have certain conditions or if you travel or work in places where you may be exposed to hepatitis B.  Haemophilus influenzae type b (Hib) vaccine. You may need this if you have certain risk factors.  Talk to your health care provider about which screenings and vaccines you need and how often you need them. This information is not intended to replace advice given to you by your health care provider. Make sure you discuss any questions you have with your health care provider. Document Released: 12/08/2015 Document Revised: 07/31/2016 Document Reviewed: 09/12/2015 Elsevier Interactive Patient Education  [employer].    If you have lab work done today you will be contacted with your lab results within the next 2 weeks.  If you have not heard from [place] then please contact us. The fastest way to get your results is to register for My Chart.   IF you received an x-ray today, you will receive an invoice from [facility]. Please contact [facility] at [phone] with questions or concerns regarding your invoice.   IF you received labwork today, you will receive an invoice from [place]. Please contact [facility] at [phone] with questions or concerns regarding your invoice.   Our billing staff will not be able to assist you with questions regarding bills from these companies.  You will be contacted with the lab results as soon as they are available. The fastest way to get your results is to activate your My Chart account. Instructions are located on the last page of this paperwork. If you have not heard from [place] regarding the results in 2 weeks, please contact this office.

## 2018-08-31 NOTE — Assessment & Plan Note (Signed)
Well-controlled on bisoprolol.

## 2018-08-31 NOTE — Progress Notes (Signed)
PCP: [name], MD  Clinic Note: Chief Complaint  Patient presents with  . 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 Coronary Artery Disease    HPI: Bruce Williams is a 67 y.o. male with a PMH below who presents today to establish new cardiology care and for DOT physical evaluation.  He is being seen today at the request of [name], MD.  Bruce Williams has a distant history of MI with a PCI to the RCA back in 1998 while living in [place].  He also has hypertension hyperlipidemia and obesity with OSA on CPAP.  Bruce Williams was last seen on August 27 by Dr. Nyoka Cowden for initial patient evaluation while establishing primary care  Provider.  At that time, he requested transfer to [facility] cardiologist to keep his primary care and cardiologist in the same health network.  He was last seen At [facility] (Dr. Rozann Lesches) on October 18 and then had a follow-up Stress Echo for DOT physical.  He denied any anginal symptoms any heart failure symptoms.  Blood pressure was stable and lipids are stable/at target.  Recent Hospitalizations: None  Studies Personally Reviewed - (if available, images/films reviewed: From Epic Chart or Care Everywhere)  Stress Echo September 17, 2017: Pre-stress normal LV function at rest but no inferior posterior akinesis..  Exercised for: 35 min.  7.4 METS --> fair functional capacity.  Inferior posterior wall akinesis at rest consistent with prior MI --> maintained inferior posterior akinesis with exertion.  No evidence of ischemia.  Interval History: Bruce Williams is a pleasant morbidly obese gentleman with a distant history of most likely inferior posterior MI back from 1998 where he had PCI of the RCA.  He tells me that since that time he has had several stress tests that have been nonischemic but has not had any further heart catheterizations.  He has not had any further episodes of chest tightness pressure with rest or  exertion.  No anginal symptoms. He has mild lower extremity edema but no PND orthopnea.  He denies any rapid irregular heartbeats or palpitations.  No syncope or near syncope.  No TIA or amaurosis fugax. He really has not changed his medications in quite some time as well.  No melena, hematochezia, hematuria, or epstaxis. No claudication.  ROS: A comprehensive was performed. Review of Systems  Constitutional: Negative.  Negative for diaphoresis and malaise/fatigue.  HENT: Negative for congestion.   Respiratory: Positive for shortness of breath (If he overexerts himself, but not with routine activity or routine exercise.).   Cardiovascular:       Per HPI  Gastrointestinal: Negative for abdominal pain, constipation and heartburn.  Musculoskeletal: Negative for falls.  Neurological: Negative for dizziness and focal weakness.  Psychiatric/Behavioral: Negative.   All other systems reviewed and are negative.  I have reviewed and (if needed) personally updated the patient's problem list, medications, allergies, past medical and surgical history, social and family history.   Past Medical History:  Diagnosis Date  . Coronary artery disease involving native heart without angina pectoris 1998   No angina since MI in [place] Echo 08/2017  . [place] [address]   [place] - BMS PCI RCA (inferoposterior Akinesis on Echo).  . Hyperlipidemia with target low density lipoprotein (LDL) cholesterol less than 70 mg/dL   . OSA on CPAP     Past Surgical History:  Procedure Laterality Date  .  ANKLE FRACTURE SURGERY Right 1980's  . TREADMILL STRESS ECHO  09/17/2017   Pre-stress normal LV function at rest but no inferior posterior akinesis..  Exercised for: 35 min.  7.4 METS --> fair functional capacity.  Inferior posterior wall akinesis at rest consistent with prior MI --> maintained inferior posterior akinesis with exertion.  No evidence of ischemia.     Current Meds  Medication Sig  . aspirin EC 81 MG tablet Take 81 mg by mouth daily.  . bisoprolol (ZEBETA) 5 MG tablet Take 0.5 tablets (2.5 mg total) by mouth daily.  . Multiple Vitamin (MULTI-VITAMINS) TABS Take by mouth.  . rosuvastatin (CRESTOR) 20 MG tablet Take 1 tablet (20 mg total) by mouth daily.  Marland Kitchen triamcinolone cream (KENALOG) 0.1 % Apply to patches of dry skin 2 times daily as needed    No Known Allergies  Social History   Tobacco Use  . Smoking status: Former Smoker    Types: Cigarettes    Last attempt to quit: 07/21/2009    Years since quitting: 9.1  . Smokeless tobacco: Never Used  . Tobacco comment: currenty vapes (tabaco)  Substance Use Topics  . Alcohol use: Not Currently  . Drug use: Never    Social History   Social History Narrative   Married father 2 with 5 grandchildren and 2 great-grandchildren.  He lives with his current wife of 9 years.   He walks about 30 minutes 5 days a week.    family history includes CAD in his father and mother; Dementia in his father; Diabetes in his mother.    Wt Readings from Last 3 Encounters:  08/31/18 (!) 329 lb (149.2 kg)  08/31/18 (!) 328 lb 12.8 oz (149.1 kg)  08/04/18 (!) 329 lb (149.2 kg)    PHYSICAL EXAM BP 127/81 (BP Location: Right Arm)   Pulse 80   Ht 5\' 11"  (1.803 m)   Wt (!) 328 lb 12.8 oz (149.1 kg)   BMI 45.86 kg/m  Physical Exam  Constitutional: He is oriented to person, place, and time. No distress.  Morbidly obese gentleman with a BMI of almost 46.  Well-groomed.  HENT:  Head: Normocephalic and atraumatic.  Mouth/Throat: No oropharyngeal exudate.  Eyes: Pupils are equal, round, and reactive to light. Conjunctivae and EOM are normal. Scleral icterus is present.  Neck: Normal range of motion. Neck supple. No hepatojugular reflux (Cannot assess due to body habitus) and no JVD (Cannot assess due to body habitus) present. Carotid bruit is not present. No thyromegaly present.  Cardiovascular:  Normal rate, regular rhythm and S1 normal.  No extrasystoles are present. PMI is not displaced (Unable to palpate due to body habitus). Exam reveals distant heart sounds and decreased pulses (Related to body habitus and mild edema.). Exam reveals no gallop and no friction rub.  No murmur heard. Pulmonary/Chest: Effort normal and breath sounds normal. He has no wheezes. He has no rales.  Distant breath sounds but no  Abdominal: Soft. Bowel sounds are normal. He exhibits no distension. There is no rebound.  Obese.  Unable to assess HSM  Musculoskeletal: Normal range of motion. He exhibits edema (Trivial bilateral ankle).  Neurological: He is alert and oriented to person, place, and time. No cranial nerve deficit.  Skin: He is not diaphoretic.  Psychiatric: He has a normal mood and affect. His behavior is normal. Judgment and thought content normal.  Vitals reviewed.    Adult ECG Report  Rate: 66;  Rhythm: normal sinus rhythm and Normal axis,  intervals and durations.;   Narrative Interpretation: Normal EKG  Other studies Reviewed: Additional studies/ records that were reviewed today include:  Recent Labs:  [facility] 07/21/18: TC140, TG 60, HDL 60, LDL 65.  Creatinine 0.97.   ASSESSMENT / PLAN: Problem List Items Addressed This Visit    Coronary artery disease involving native heart without angina pectoris (Chronic)    No active anginal symptoms.  Negative stress test last year with no evidence of ischemia.  Clinically would not require any type of ischemic evaluation.  He is on aspirin statin and beta-blocker.  Certainly weight loss recommendations are in order, but otherwise no medical changes.      Relevant Orders   EKG 12-Lead   Encounter for physical examination related to employment - Primary    He is pending DOT physical on Wednesday, but he does not know where he is actually having to go.  I am not sure what the requirements would be, I think since he had a stress test done last year,  he should be fine in the absence of symptoms and would not need to have another evaluation. 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  Since I would not clinically check a stress test, I will wait and see what the DOT physical requirements are.  He will let us know once he hears from the clinic but he is going to.  If he does need a stress test we can get that ordered.      Relevant Orders   EKG 12-Lead   Essential hypertension (Chronic)    Well-controlled on bisoprolol.      Hyperlipidemia with target LDL less than 70 (Chronic)    Below 70 LDL on stable dose of statin. I suspect that with weight loss, his numbers would improve --> would continue to treat with current dose of Crestor, but eventually goal would be less than 50 for LDL.      Irregular heart beats (Chronic)    He says he has had history of "irregular heartbeats for years, but never said he had any arrhythmia.  He says the most of these irregular heartbeats are controlled with the low-dose bisoprolol.  No symptoms to suggest A. fib or any other arrhythmia.      Relevant Orders   EKG 12-Lead      I spent a total of 45 minutes with the patient and chart review. >  50% of the time was spent in direct patient consultation.  In addition to the time with the patient, additional time was spent reviewing the chart In [place] for his most recent cardiac evaluation.  His past medical history was updated in EPIC.  Current medicines are reviewed at length with the patient today.  (+/- concerns) none The following changes have been made:  None  Patient Instructions  Medication Instructions:   NOT NEEDED If you need a refill on your cardiac medications before your next appointment, please call your pharmacy.   Lab work: NOT NEEDED If you have labs (blood work) drawn today and your tests are completely normal, you will receive your  results only by: Marland Kitchen MyChart Message (if you have MyChart) OR . A paper copy in the mail If you have any lab test that is abnormal or we need to change your treatment, we will call you to review the results.  Testing/Procedures: NOT NEEDED   Follow-Up: At  [facility], you and your health needs are our priority.  As part of our continuing mission to provide you with exceptional heart care, we have created designated Provider Care Teams.  These Care Teams include your primary Cardiologist (physician) and Advanced Practice Providers (APPs -  Physician Assistants and Nurse Practitioners) who all work together to provide you with the care you need, when you need it. You will need a follow up appointment in 10 months (AUG 2020).  Please call our office 2 months in advance to schedule this appointment.  You may see Glenetta Hew, MD or one of the following Advanced Practice Providers on your designated Care Team:   Rosaria Ferries, PA-C . Jory Sims, DNP, ANP  Any Other Special Instructions Will Be Listed Below :.  PLEASE CONTACT OFFICE ONCE YOU SPEAK TO  D.OT.  EXAMINER - IF YOU NEEDED  TO HAVE  TESTING DONE. OUR OFFICE WILL SCHEDULE YOU FOR  EXERCISE TOLERANCE TEST    Studies Ordered:   Orders Placed This Encounter  Procedures  . EKG 12-Lead      Glenetta Hew, M.D., M.S. Interventional Cardiologist   Pager # [phone] Phone # [phone] [address] [place], [place] [address]   Thank you for choosing Heartcare at [facility]!!

## 2018-08-31 NOTE — Assessment & Plan Note (Signed)
No active anginal symptoms.  Negative stress test last year with no evidence of ischemia.  Clinically would not require any type of ischemic evaluation.  He is on aspirin statin and beta-blocker.  Certainly weight loss recommendations are in order, but otherwise no medical changes.

## 2018-09-02 ENCOUNTER — Encounter: Payer: Self-pay | Admitting: Neurology

## 2018-09-02 ENCOUNTER — Ambulatory Visit: Payer: Medicare Other | Admitting: Family Medicine

## 2018-09-17 ENCOUNTER — Encounter

## 2018-09-17 ENCOUNTER — Institutional Professional Consult (permissible substitution): Payer: Medicare Other | Admitting: Neurology

## 2019-02-16 ENCOUNTER — Telehealth: Payer: Self-pay | Admitting: Family Medicine

## 2019-02-16 ENCOUNTER — Other Ambulatory Visit: Payer: Self-pay

## 2019-02-16 DIAGNOSIS — E785 Hyperlipidemia, unspecified: Secondary | ICD-10-CM

## 2019-02-16 DIAGNOSIS — I2510 Atherosclerotic heart disease of native coronary artery without angina pectoris: Secondary | ICD-10-CM

## 2019-02-16 MED ORDER — ROSUVASTATIN CALCIUM 20 MG PO TABS: 20.0000 mg | ORAL_TABLET | Freq: Every day | ORAL | 0 refills | Status: DC

## 2019-02-16 MED ORDER — BISOPROLOL FUMARATE 5 MG PO TABS: 2.5000 mg | ORAL_TABLET | Freq: Every day | ORAL | 0 refills | Status: DC

## 2019-02-16 NOTE — Telephone Encounter (Signed)
Copied from [place] [id number]. Topic: Quick Communication - Rx Refill/Question >> Feb 16, 2019 11:30 AM Leward Quan A wrote: Medication: rosuvastatin (CRESTOR) 20 MG tablet, bisoprolol (ZEBETA) 5 MG tablet, rosuvastatin (CRESTOR) 20 MG tablet and Ibuprofen 800 mg  Has the patient contacted their pharmacy? Yes.   (Agent: If no, request that the patient contact the pharmacy for the refill.) (Agent: If yes, when and what did the pharmacy advise?)  Preferred Pharmacy (with phone number or street name): [place] [place] [phone] (Phone) [phone] (Fax)    Agent: Please be advised that RX refills may take up to 3 business days. We ask that you follow-up with your pharmacy.

## 2019-02-16 NOTE — Telephone Encounter (Signed)
RX sent to pharmacy  

## 2019-02-17 NOTE — Telephone Encounter (Signed)
Wife called back to advise they do not use [facility] any more. 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

## 2019-02-17 NOTE — Telephone Encounter (Signed)
The number to the pharmacy the meds need to be sent to is: [facility] [phone].

## 2019-02-22 ENCOUNTER — Other Ambulatory Visit: Payer: Self-pay | Admitting: Emergency Medicine

## 2019-02-22 ENCOUNTER — Telehealth: Payer: Self-pay | Admitting: Emergency Medicine

## 2019-02-22 DIAGNOSIS — E785 Hyperlipidemia, unspecified: Secondary | ICD-10-CM

## 2019-02-22 DIAGNOSIS — I2510 Atherosclerotic heart disease of native coronary artery without angina pectoris: Secondary | ICD-10-CM

## 2019-02-22 MED ORDER — BISOPROLOL FUMARATE 5 MG PO TABS: 2.5000 mg | ORAL_TABLET | Freq: Every day | ORAL | 0 refills | Status: DC

## 2019-02-22 MED ORDER — ROSUVASTATIN CALCIUM 20 MG PO TABS: 20.0000 mg | ORAL_TABLET | Freq: Every day | ORAL | 0 refills | Status: DC

## 2019-02-22 NOTE — Telephone Encounter (Signed)
Wife returning [place] call.

## 2019-02-23 NOTE — Telephone Encounter (Signed)
Spoke to patient wife rx has been sent and new rx for Ibuprofen 800 mg has toi be approved by Dr Carlota Raspberry. Message has been sent

## 2019-02-23 NOTE — Telephone Encounter (Signed)
With hx of CAD I do have some concerns with him using high-dose NSAID.  I do not see where we have discussed it particularly recently.  How often is he using that medicine and has he discussed that with his cardiologist?

## 2019-02-24 NOTE — Telephone Encounter (Signed)
rx sent to [facility] on 02/22/2019

## 2019-02-25 NOTE — Telephone Encounter (Signed)
Called twice and got a busy signal both times. Will try again later.

## 2019-04-05 ENCOUNTER — Other Ambulatory Visit: Payer: Self-pay | Admitting: Family Medicine

## 2019-04-05 DIAGNOSIS — I2510 Atherosclerotic heart disease of native coronary artery without angina pectoris: Secondary | ICD-10-CM

## 2019-04-05 DIAGNOSIS — E785 Hyperlipidemia, unspecified: Secondary | ICD-10-CM

## 2019-04-24 ENCOUNTER — Other Ambulatory Visit: Payer: Self-pay | Admitting: Family Medicine

## 2019-04-24 DIAGNOSIS — I2510 Atherosclerotic heart disease of native coronary artery without angina pectoris: Secondary | ICD-10-CM

## 2019-04-24 DIAGNOSIS — E785 Hyperlipidemia, unspecified: Secondary | ICD-10-CM

## 2019-05-08 DIAGNOSIS — Z85828 Personal history of other malignant neoplasm of skin: Secondary | ICD-10-CM | POA: Diagnosis not present

## 2019-05-08 DIAGNOSIS — Z87891 Personal history of nicotine dependence: Secondary | ICD-10-CM | POA: Diagnosis not present

## 2019-05-08 DIAGNOSIS — Z7982 Long term (current) use of aspirin: Secondary | ICD-10-CM | POA: Diagnosis not present

## 2019-05-08 DIAGNOSIS — Z833 Family history of diabetes mellitus: Secondary | ICD-10-CM | POA: Diagnosis not present

## 2019-05-08 DIAGNOSIS — E785 Hyperlipidemia, unspecified: Secondary | ICD-10-CM | POA: Diagnosis not present

## 2019-05-08 DIAGNOSIS — I2510 Atherosclerotic heart disease of native coronary artery without angina pectoris: Secondary | ICD-10-CM | POA: Diagnosis not present

## 2019-05-08 DIAGNOSIS — Z955 Presence of coronary angioplasty implant and graft: Secondary | ICD-10-CM | POA: Diagnosis not present

## 2019-05-08 DIAGNOSIS — Z825 Family history of asthma and other chronic lower respiratory diseases: Secondary | ICD-10-CM | POA: Diagnosis not present

## 2019-05-08 DIAGNOSIS — I252 Old myocardial infarction: Secondary | ICD-10-CM | POA: Diagnosis not present

## 2019-05-11 ENCOUNTER — Other Ambulatory Visit: Payer: Self-pay | Admitting: Family Medicine

## 2019-05-11 DIAGNOSIS — E785 Hyperlipidemia, unspecified: Secondary | ICD-10-CM

## 2019-05-11 DIAGNOSIS — I2510 Atherosclerotic heart disease of native coronary artery without angina pectoris: Secondary | ICD-10-CM

## 2019-06-03 ENCOUNTER — Telehealth: Payer: Self-pay | Admitting: Family Medicine

## 2019-06-03 NOTE — Telephone Encounter (Signed)
Vinnie Level with [facility] is calling and the pt needs generic crestor 20 mg #90 w/refills [facility]

## 2019-06-07 ENCOUNTER — Other Ambulatory Visit: Payer: Self-pay | Admitting: *Deleted

## 2019-06-07 DIAGNOSIS — I2510 Atherosclerotic heart disease of native coronary artery without angina pectoris: Secondary | ICD-10-CM

## 2019-06-07 DIAGNOSIS — E785 Hyperlipidemia, unspecified: Secondary | ICD-10-CM

## 2019-06-07 MED ORDER — ROSUVASTATIN CALCIUM 20 MG PO TABS: 20.0000 mg | ORAL_TABLET | Freq: Every day | ORAL | 0 refills | Status: DC

## 2019-06-07 NOTE — Telephone Encounter (Signed)
Prescription sent

## 2019-06-08 DIAGNOSIS — G4733 Obstructive sleep apnea (adult) (pediatric): Secondary | ICD-10-CM | POA: Diagnosis not present

## 2019-06-17 ENCOUNTER — Other Ambulatory Visit: Payer: Self-pay

## 2019-06-17 DIAGNOSIS — I2510 Atherosclerotic heart disease of native coronary artery without angina pectoris: Secondary | ICD-10-CM

## 2019-06-17 MED ORDER — BISOPROLOL FUMARATE 5 MG PO TABS: 5.0000 mg | ORAL_TABLET | Freq: Every day | ORAL | 5 refills | Status: DC

## 2019-07-09 DIAGNOSIS — G4733 Obstructive sleep apnea (adult) (pediatric): Secondary | ICD-10-CM | POA: Diagnosis not present

## 2019-08-09 DIAGNOSIS — G4733 Obstructive sleep apnea (adult) (pediatric): Secondary | ICD-10-CM | POA: Diagnosis not present

## 2019-09-06 DIAGNOSIS — G4733 Obstructive sleep apnea (adult) (pediatric): Secondary | ICD-10-CM | POA: Diagnosis not present

## 2019-10-07 DIAGNOSIS — G4733 Obstructive sleep apnea (adult) (pediatric): Secondary | ICD-10-CM | POA: Diagnosis not present

## 2019-10-21 ENCOUNTER — Other Ambulatory Visit: Payer: Self-pay | Admitting: Family Medicine

## 2019-10-21 ENCOUNTER — Encounter: Payer: Self-pay | Admitting: Family Medicine

## 2019-10-21 DIAGNOSIS — I2510 Atherosclerotic heart disease of native coronary artery without angina pectoris: Secondary | ICD-10-CM

## 2019-10-21 DIAGNOSIS — E785 Hyperlipidemia, unspecified: Secondary | ICD-10-CM

## 2019-10-25 ENCOUNTER — Other Ambulatory Visit: Payer: Self-pay | Admitting: Family Medicine

## 2019-10-25 DIAGNOSIS — E785 Hyperlipidemia, unspecified: Secondary | ICD-10-CM

## 2019-10-25 DIAGNOSIS — I2510 Atherosclerotic heart disease of native coronary artery without angina pectoris: Secondary | ICD-10-CM

## 2019-10-27 ENCOUNTER — Telehealth: Payer: Self-pay | Admitting: *Deleted

## 2019-10-27 DIAGNOSIS — I2510 Atherosclerotic heart disease of native coronary artery without angina pectoris: Secondary | ICD-10-CM

## 2019-10-27 DIAGNOSIS — E785 Hyperlipidemia, unspecified: Secondary | ICD-10-CM

## 2019-10-27 MED ORDER — BISOPROLOL FUMARATE 5 MG PO TABS: 5.0000 mg | ORAL_TABLET | Freq: Every day | ORAL | 0 refills | Status: DC

## 2019-10-27 MED ORDER — ROSUVASTATIN CALCIUM 20 MG PO TABS: 20.0000 mg | ORAL_TABLET | Freq: Every day | ORAL | 0 refills | Status: DC

## 2019-10-27 NOTE — Addendum Note (Signed)
Addended by: Merri Ray R on: 10/27/2019 07:29 PM   Modules accepted: Orders

## 2019-10-27 NOTE — Telephone Encounter (Signed)
Labs were okay last year, but those do need to be repeated.  Lab only order was placed if that makes it easier for him.  I extended prescription for an additional 30 days. 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  Per last cardiology appt: "you will need a follow up appointment in 10 months (AUG 2020).  Please call our office 2 months in advance to schedule this appointment.  You may see Glenetta Hew, MD or one of the following Advanced Practice Providers on your designated Care Team:    Rosaria Ferries, PA-C  Jory Sims, DNP, ANP"

## 2019-10-27 NOTE — Telephone Encounter (Signed)
Dr. 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   I explained the precautions we are taking and he would need some labs, but stated if the prescription is not refilled without him being seen he is going to just stop taking it.     He is scheduled for AWV over the phone 11-02-2019.

## 2019-10-28 ENCOUNTER — Encounter: Payer: Self-pay | Admitting: Family Medicine

## 2019-11-01 NOTE — Telephone Encounter (Signed)
Already sent in to pharm at 10/29/19

## 2019-11-02 ENCOUNTER — Telehealth: Payer: Self-pay | Admitting: *Deleted

## 2019-11-02 ENCOUNTER — Ambulatory Visit (INDEPENDENT_AMBULATORY_CARE_PROVIDER_SITE_OTHER): Payer: Medicare HMO | Admitting: Family Medicine

## 2019-11-02 VITALS — BP 133/69 | Ht 71.0 in | Wt 335.0 lb

## 2019-11-02 DIAGNOSIS — Z0000 Encounter for general adult medical examination without abnormal findings: Secondary | ICD-10-CM | POA: Diagnosis not present

## 2019-11-02 NOTE — Patient Instructions (Signed)
Thank you for taking time to come for your Medicare Wellness Visit. I appreciate your ongoing commitment to your health goals. Please review the following plan we discussed and let me know if I can assist you in the future.  Bruce Kennedy LPN  Preventive Care 64 Years and Older, Male Preventive care refers to lifestyle choices and visits with your health care provider that can promote health and wellness. This includes:  A yearly physical exam. This is also called an annual well check.  Regular dental and eye exams.  Immunizations.  Screening for certain conditions.  Healthy lifestyle choices, such as diet and exercise. What can I expect for my preventive care visit? Physical exam Your health care provider will check:  Height and weight. These may be used to calculate body mass index (BMI), which is a measurement that tells if you are at a healthy weight.  Heart rate and blood pressure.  Your skin for abnormal spots. Counseling Your health care provider may ask you questions about:  Alcohol, tobacco, and drug use.  Emotional well-being.  Home and relationship well-being.  Sexual activity.  Eating habits.  History of falls.  Memory and ability to understand (cognition).  Work and work Statistician. What immunizations do I need?  Influenza (flu) vaccine  This is recommended every year. Tetanus, diphtheria, and pertussis (Tdap) vaccine  You may need a Td booster every 10 years. Varicella (chickenpox) vaccine  You may need this vaccine if you have not already been vaccinated. Zoster (shingles) vaccine  You may need this after age 66. Pneumococcal conjugate (PCV13) vaccine  One dose is recommended after age 84. Pneumococcal polysaccharide (PPSV23) vaccine  One dose is recommended after age 88. Measles, mumps, and rubella (MMR) vaccine  You may need at least one dose of MMR if you were born in 1957 or later. You may also need a second dose. Meningococcal  conjugate (MenACWY) vaccine  You may need this if you have certain conditions. Hepatitis A vaccine  You may need this if you have certain conditions or if you travel or work in places where you may be exposed to hepatitis A. Hepatitis B vaccine  You may need this if you have certain conditions or if you travel or work in places where you may be exposed to hepatitis B. Haemophilus influenzae type b (Hib) vaccine  You may need this if you have certain conditions. You may receive vaccines as individual doses or as more than one vaccine together in one shot (combination vaccines). Talk with your health care provider about the risks and benefits of combination vaccines. What tests do I need? Blood tests  Lipid and cholesterol levels. These may be checked every 5 years, or more frequently depending on your overall health.  Hepatitis C test.  Hepatitis B test. Screening  Lung cancer screening. You may have this screening every year starting at age 24 if you have a 30-pack-year history of smoking and currently smoke or have quit within the past 15 years.  Colorectal cancer screening. All adults should have this screening starting at age 52 and continuing until age 61. Your health care provider may recommend screening at age 57 if you are at increased risk. You will have tests every 1-10 years, depending on your results and the type of screening test.  Prostate cancer screening. Recommendations will vary depending on your family history and other risks.  Diabetes screening. This is done by checking your blood sugar (glucose) after you have not eaten for  a while (fasting). You may have this done every 1-3 years.  Abdominal aortic aneurysm (AAA) screening. You may need this if you are a current or former smoker.  Sexually transmitted disease (STD) testing. Follow these instructions at home: Eating and drinking  Eat a diet that includes fresh fruits and vegetables, whole grains, lean  protein, and low-fat dairy products. Limit your intake of foods with high amounts of sugar, saturated fats, and salt.  Take vitamin and mineral supplements as recommended by your health care provider.  Do not drink alcohol if your health care provider tells you not to drink.  If you drink alcohol: ? Limit how much you have to 0-2 drinks a day. ? Be aware of how much alcohol is in your drink. In the [place] one drink equals one 12 oz bottle of beer (355 mL), one 5 oz glass of wine (148 mL), or one 1 oz glass of hard liquor (44 mL). Lifestyle  Take daily care of your teeth and gums.  Stay active. Exercise for at least 30 minutes on 5 or more days each week.  Do not use any products that contain nicotine or tobacco, such as cigarettes, e-cigarettes, and chewing tobacco. If you need help quitting, ask your health care provider.  If you are sexually active, practice safe sex. Use a condom or other form of protection to prevent STIs (sexually transmitted infections).  Talk with your health care provider about taking a low-dose aspirin or statin. What's next?  Visit your health care provider once a year for a well check visit.  Ask your health care provider how often you should have your eyes and teeth checked.  Stay up to date on all vaccines. This information is not intended to replace advice given to you by your health care provider. Make sure you discuss any questions you have with your health care provider. Document Released: 12/08/2015 Document Revised: 11/05/2018 Document Reviewed: 11/05/2018 Elsevier Patient Education  2020 [employer].

## 2019-11-02 NOTE — Telephone Encounter (Signed)
Dr. Carlota Raspberry,   I spoke with Bruce Williams today during his AWV after explaining the precautions we are taken he has agreed to come in and do his lab work and then book a tele-med visit.      He states he is not comfortable staying at office but will come in have labs done and then leave.      Can you put labs in for him?

## 2019-11-02 NOTE — Telephone Encounter (Signed)
Disregard previous message labs are in.    Thank You

## 2019-11-02 NOTE — Progress Notes (Addendum)
Presents today for [employer] Visit   Date of last exam: 07/21/2018  Interpreter used for this visit? No  I connected with  [name] on 11/02/19 by a telephone application and verified that I am speaking with the correct person using two identifiers.   I discussed the limitations of evaluation and management by telemedicine. The patient expressed understanding and agreed to proceed.   Patient Care Team: [name], MD as PCP - General (Family Medicine) Leonie Man, MD as PCP - Cardiology (Cardiology)   Other items to address today:   Discussed immunizations Patient Declined FLU-SHOT Patient declined TDAP due to insurance Discussed Eye/Dental Future orders for lab put in patient will have labs done then make a TELE-MED visit   Other Screening: Last screening for diabetes: 07/21/2018 Last lipid screening: 07/21/2018  ADVANCE DIRECTIVES: Discussed: yes On File: no Materials Provided: yes (mailed)  Immunization status:  Immunization History  Administered Date(s) Administered  . Influenza,inj,Quad PF,6+ Mos 09/26/2016  . Influenza-Unspecified 09/13/2015     Health Maintenance Due  Topic Date Due  . Hepatitis C Screening  01/13/1951  . TETANUS/TDAP  02/20/1972  . COLONOSCOPY  02/20/2003  . PNA vac Low Risk Adult (1 of 2 - PCV13) 02/19/2018     Functional Status Survey: Is the patient deaf or have difficulty hearing?: No Does the patient have difficulty seeing, even when wearing glasses/contacts?: No Does the patient have difficulty concentrating, remembering, or making decisions?: No Does the patient have difficulty walking or climbing stairs?: No Does the patient have difficulty dressing or bathing?: No Does the patient have difficulty doing errands alone such as visiting a doctor's office or shopping?: No   6CIT Screen 11/02/2019 08/31/2018  What Year? 0 points 0 points  What month? 0 points 0 points  What time? 0 points 0  points  Count back from 20 0 points 0 points  Months in reverse 0 points 0 points  Repeat phrase 4 points 0 points  Total Score 4 0        Clinical Support from 11/02/2019 in [facility]  AUDIT-C Score  1       Home Environment:   No difficulty stairs Lives in one story home No scattered No grab bars Adequate lighting/no clutter   Patient Active Problem List   Diagnosis Date Noted  . Essential hypertension 08/31/2018  . Irregular heart beats 08/31/2018  . Encounter for physical examination related to employment 08/31/2018  . Coronary artery disease involving native heart without angina pectoris   . Hyperlipidemia with target LDL less than 70      Past Medical History:  Diagnosis Date  . Coronary artery disease involving native heart without angina pectoris 1998   No angina since MI in [place] Echo 08/2017  . [place] [address]   [place] - BMS PCI RCA (inferoposterior Akinesis on Echo).  . Hyperlipidemia with target low density lipoprotein (LDL) cholesterol less than 70 mg/dL   . OSA on CPAP      Past Surgical History:  Procedure Laterality Date  . ANKLE FRACTURE SURGERY Right 1980's  . TREADMILL STRESS ECHO  09/17/2017   Pre-stress normal LV function at rest but no inferior posterior akinesis..  Exercised for: 35 min.  7.4 METS --> fair functional capacity.  Inferior posterior wall akinesis at rest consistent with prior MI --> maintained inferior posterior akinesis with exertion.  No evidence of ischemia.  Family History  Problem Relation Age of Onset  . CAD Mother   . Diabetes Mother   . CAD Father   . Dementia Father      Social History   Socioeconomic History  . Marital status: Married    Spouse name: Not on file  . Number of children: 2  . Years of education: 13  . Highest education level: Some college, no degree  Occupational History  . Occupation: DRIVER    Comment: [employer] - Requires [employer] License  Social Needs  . Financial resource strain: Not on file  . Food insecurity    Worry: Not on file    Inability: Not on file  . Transportation needs    Medical: Not on file    Non-medical: Not on file  Tobacco Use  . Smoking status: Former Smoker    Types: Cigarettes    Quit date: 07/21/2009    Years since quitting: 10.2  . Smokeless tobacco: Never Used  . Tobacco comment: currenty vapes (tabaco)  Substance and Sexual Activity  . Alcohol use: Not Currently  . Drug use: Never  . Sexual activity: Not on file  Lifestyle  . Physical activity    Days per week: Not on file    Minutes per session: Not on file  . Stress: Not on file  Relationships  . Social Herbalist on phone: Not on file    Gets together: Not on file    Attends religious service: Not on file    Active member of club or organization: Not on file    Attends meetings of clubs or organizations: Not on file    Relationship status: Not on file  . Intimate partner violence    Fear of current or ex partner: Not on file    Emotionally abused: Not on file    Physically abused: Not on file    Forced sexual activity: Not on file  Other Topics Concern  . Not on file  Social History Narrative   Married father 2 with 5 grandchildren and 2 great-grandchildren.  He lives with his current wife of 9 years.   He walks about 30 minutes 5 days a week.     No Known Allergies   Prior to Admission medications   Medication Sig Start Date End Date Taking? Authorizing Provider  aspirin EC 81 MG tablet Take 81 mg by mouth daily.   Yes [provider]  bisoprolol (ZEBETA) 5 MG tablet Take 1 tablet (5 mg total) by mouth daily. 10/27/19  Yes [name], MD  rosuvastatin (CRESTOR) 20 MG tablet Take 1 tablet (20 mg total) by mouth daily. 10/27/19  Yes [name], MD  triamcinolone cream (KENALOG) 0.1 % Apply to patches of dry skin 2 times daily as needed 07/21/17  Yes [provider]  Multiple Vitamin (MULTI-VITAMINS) TABS Take by mouth.    [provider]     Depression screen [facility] 2/9 11/02/2019 08/31/2018 07/21/2018  Decreased Interest 0 0 0  Down, Depressed, Hopeless 0 0 0  PHQ - 2 Score 0 0 0     Fall Risk  11/02/2019 08/31/2018 07/21/2018  Falls in the past year? 0 No No  Number falls in past yr: 0 - -  Injury with Fall? 0 - -  Risk for fall due to : History of fall(s) - -  Follow up Falls evaluation completed;Education provided - -      PHYSICAL  EXAM: BP 133/69 Comment: taken from previous visit  Ht 5\' 11"  (1.803 m)   Wt (!) 335 lb (152 kg) Comment: per patient  BMI 46.72 kg/m    Wt Readings from Last 3 Encounters:  11/02/19 (!) 335 lb (152 kg)  08/31/18 (!) 329 lb (149.2 kg)  08/31/18 (!) 328 lb 12.8 oz (149.1 kg)    Medicare annual wellness visit, subsequent   Education/Counseling provided regarding diet and exercise, prevention of chronic diseases, smoking/tobacco cessation, if applicable, and reviewed "Covered Medicare Preventive Services."

## 2019-11-04 NOTE — Telephone Encounter (Signed)
Can you follow up with this pt.

## 2019-11-06 DIAGNOSIS — G4733 Obstructive sleep apnea (adult) (pediatric): Secondary | ICD-10-CM | POA: Diagnosis not present

## 2019-12-14 DIAGNOSIS — G4733 Obstructive sleep apnea (adult) (pediatric): Secondary | ICD-10-CM | POA: Diagnosis not present

## 2019-12-29 ENCOUNTER — Other Ambulatory Visit: Payer: Self-pay | Admitting: Family Medicine

## 2019-12-29 DIAGNOSIS — I2510 Atherosclerotic heart disease of native coronary artery without angina pectoris: Secondary | ICD-10-CM

## 2019-12-29 DIAGNOSIS — E785 Hyperlipidemia, unspecified: Secondary | ICD-10-CM

## 2019-12-30 MED ORDER — ROSUVASTATIN CALCIUM 20 MG PO TABS: 20.0000 mg | ORAL_TABLET | Freq: Every day | ORAL | 0 refills | Status: DC

## 2019-12-30 MED ORDER — BISOPROLOL FUMARATE 5 MG PO TABS: 5.0000 mg | ORAL_TABLET | Freq: Every day | ORAL | 0 refills | Status: DC

## 2020-01-03 ENCOUNTER — Other Ambulatory Visit: Payer: Self-pay

## 2020-01-03 ENCOUNTER — Ambulatory Visit (INDEPENDENT_AMBULATORY_CARE_PROVIDER_SITE_OTHER): Payer: Medicare HMO | Admitting: Family Medicine

## 2020-01-03 VITALS — BP 138/83 | HR 77 | Temp 98.7°F | Ht 71.0 in | Wt 344.0 lb

## 2020-01-03 DIAGNOSIS — Z6841 Body Mass Index (BMI) 40.0 and over, adult: Secondary | ICD-10-CM

## 2020-01-03 DIAGNOSIS — G4733 Obstructive sleep apnea (adult) (pediatric): Secondary | ICD-10-CM

## 2020-01-03 DIAGNOSIS — E785 Hyperlipidemia, unspecified: Secondary | ICD-10-CM

## 2020-01-03 DIAGNOSIS — I10 Essential (primary) hypertension: Secondary | ICD-10-CM

## 2020-01-03 DIAGNOSIS — Z0000 Encounter for general adult medical examination without abnormal findings: Secondary | ICD-10-CM | POA: Diagnosis not present

## 2020-01-03 DIAGNOSIS — I2510 Atherosclerotic heart disease of native coronary artery without angina pectoris: Secondary | ICD-10-CM

## 2020-01-03 MED ORDER — BISOPROLOL FUMARATE 5 MG PO TABS: 5.0000 mg | ORAL_TABLET | Freq: Every day | ORAL | 2 refills | Status: DC

## 2020-01-03 MED ORDER — ROSUVASTATIN CALCIUM 20 MG PO TABS: 20.0000 mg | ORAL_TABLET | Freq: Every day | ORAL | 2 refills | Status: DC

## 2020-01-03 NOTE — Patient Instructions (Addendum)
Thank you for coming in today.  Please schedule appointment with your cardiologist, and I also recommend a dental visit.  I did place a referral to general surgery to discuss weight loss surgery options and potential cost.  Please let me know if there are questions from that visit.  No change in medications today.  I will check some lab work and let you know if there are concerns.  Take care.    Preventive Care 32 Years and Older, Male Preventive care refers to lifestyle choices and visits with your health care provider that can promote health and wellness. This includes:  A yearly physical exam. This is also called an annual well check.  Regular dental and eye exams.  Immunizations.  Screening for certain conditions.  Healthy lifestyle choices, such as diet and exercise. What can I expect for my preventive care visit? Physical exam Your health care provider will check:  Height and weight. These may be used to calculate body mass index (BMI), which is a measurement that tells if you are at a healthy weight.  Heart rate and blood pressure.  Your skin for abnormal spots. Counseling Your health care provider may ask you questions about:  Alcohol, tobacco, and drug use.  Emotional well-being.  Home and relationship well-being.  Sexual activity.  Eating habits.  History of falls.  Memory and ability to understand (cognition).  Work and work Statistician. What immunizations do I need?  Influenza (flu) vaccine  This is recommended every year. Tetanus, diphtheria, and pertussis (Tdap) vaccine  You may need a Td booster every 10 years. Varicella (chickenpox) vaccine  You may need this vaccine if you have not already been vaccinated. Zoster (shingles) vaccine  You may need this after age 13. Pneumococcal conjugate (PCV13) vaccine  One dose is recommended after age 52. Pneumococcal polysaccharide (PPSV23) vaccine  One dose is recommended after age 69. Measles,  mumps, and rubella (MMR) vaccine  You may need at least one dose of MMR if you were born in 1957 or later. You may also need a second dose. Meningococcal conjugate (MenACWY) vaccine  You may need this if you have certain conditions. Hepatitis A vaccine  You may need this if you have certain conditions or if you travel or work in places where you may be exposed to hepatitis A. Hepatitis B vaccine  You may need this if you have certain conditions or if you travel or work in places where you may be exposed to hepatitis B. Haemophilus influenzae type b (Hib) vaccine  You may need this if you have certain conditions. You may receive vaccines as individual doses or as more than one vaccine together in one shot (combination vaccines). Talk with your health care provider about the risks and benefits of combination vaccines. What tests do I need? Blood tests  Lipid and cholesterol levels. These may be checked every 5 years, or more frequently depending on your overall health.  Hepatitis C test.  Hepatitis B test. Screening  Lung cancer screening. You may have this screening every year starting at age 25 if you have a 30-pack-year history of smoking and currently smoke or have quit within the past 15 years.  Colorectal cancer screening. All adults should have this screening starting at age 37 and continuing until age 72. Your health care provider may recommend screening at age 63 if you are at increased risk. You will have tests every 1-10 years, depending on your results and the type of screening test.  Prostate cancer screening. Recommendations will vary depending on your family history and other risks.  Diabetes screening. This is done by checking your blood sugar (glucose) after you have not eaten for a while (fasting). You may have this done every 1-3 years.  Abdominal aortic aneurysm (AAA) screening. You may need this if you are a current or former smoker.  Sexually transmitted  disease (STD) testing. Follow these instructions at home: Eating and drinking  Eat a diet that includes fresh fruits and vegetables, whole grains, lean protein, and low-fat dairy products. Limit your intake of foods with high amounts of sugar, saturated fats, and salt.  Take vitamin and mineral supplements as recommended by your health care provider.  Do not drink alcohol if your health care provider tells you not to drink.  If you drink alcohol: ? Limit how much you have to 0-2 drinks a day. ? Be aware of how much alcohol is in your drink. In the [place] one drink equals one 12 oz bottle of beer (355 mL), one 5 oz glass of wine (148 mL), or one 1 oz glass of hard liquor (44 mL). Lifestyle  Take daily care of your teeth and gums.  Stay active. Exercise for at least 30 minutes on 5 or more days each week.  Do not use any products that contain nicotine or tobacco, such as cigarettes, e-cigarettes, and chewing tobacco. If you need help quitting, ask your health care provider.  If you are sexually active, practice safe sex. Use a condom or other form of protection to prevent STIs (sexually transmitted infections).  Talk with your health care provider about taking a low-dose aspirin or statin. What's next?  Visit your health care provider once a year for a well check visit.  Ask your health care provider how often you should have your eyes and teeth checked.  Stay up to date on all vaccines. This information is not intended to replace advice given to you by your health care provider. Make sure you discuss any questions you have with your health care provider. Document Revised: 11/05/2018 Document Reviewed: 11/05/2018 Elsevier Patient Education  2020 [employer].

## 2020-01-03 NOTE — Progress Notes (Signed)
Subjective:  Patient ID: Bruce Williams, male    DOB: [birth year]  Age: 69 y.o. MRN: [id number]  CC:  Chief Complaint  Patient presents with  . Medicare Wellness    pt states he feels well. no complants currently. pt states medication is working well with no side effects.    HPI Bruce Williams presents for physical/med review. Medicare wellness exam on 12/8 reviewed. . History of CAD, hypertension, OSA on CPAP, obesity.   Care team Cardiology, Dr. Ellyn Hack Sleep specialist, Dr. Brett Fairy  OSA on CPAP Daily use. Feels rested.   CAD: Last visit with cardiology in 2019. Agrees to follow up. History of MI in 1998.  No chest pains. On bisoprolol, ASA 39m QD.   Hyperlipidemia: crestor 258mqd - no new side effects, prior myalgias subsided.  Lab Results  Component Value Date   CHOL 134 01/03/2020   HDL 57 01/03/2020   LDLCALC 65 01/03/2020   TRIG 55 01/03/2020   CHOLHDL 2.4 01/03/2020   Lab Results  Component Value Date   ALT 12 01/03/2020   AST 12 01/03/2020   ALKPHOS 49 01/03/2020   BILITOT 0.4 01/03/2020   Hypertension: On bisoprolol 22m78md. Home readings:120-135/70-80's.  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  Fast food:none No soda, no sweet tea.  Cooks at home during the week. Restaurant food on weekend. Has thought about surgical options - would like referral.   Cancer screening: Colon: about 3.5 years ago (when under care of other PCP) [place] polyps.  Prostate: takes prostate supplement for BPH. Urinating ok. No nocturia. Declines testing.   Hep c screen - negative in past under prior PCP.   Td - more than 10 yrs ago.   Immunization History  Administered Date(s) Administered  . Influenza,inj,Quad PF,6+ Mos 09/26/2016  . Influenza-Unspecified 09/13/2015    shingles - declines.  covid vaccine - declines. Wants to see long term results with vaccine. Declines discussion further with vaccine at this point.   [facility] 01/03/2020 11/02/2019 08/31/2018 07/21/2018  Decreased Interest 0 0 0 0  Down, Depressed, Hopeless 0 0 0 0  PHQ - 2 Score 0 0 0 0    Hearing Screening   '125Hz'  '250Hz'  '500Hz'  '1000Hz'  '2000Hz'  '3000Hz'  '4000Hz'  '6000Hz'  '8000Hz'   Right ear:           Left ear:             Visual Acuity Screening   Right eye Left eye Both eyes  Without correction:     With correction: '20/20 20/25 20/25 '  optho- 1.5 yrs. 2 year follow up.  Dentist: none recently.   Exercise as above.   History Patient Active Problem List   Diagnosis Date Noted  . Essential hypertension 08/31/2018  . Irregular heart beats 08/31/2018  . Encounter for physical examination related to employment 08/31/2018  . Coronary artery disease involving native heart without angina pectoris   . Hyperlipidemia with target LDL less than 70    Past Medical History:  Diagnosis Date  . Coronary artery disease involving native heart without angina pectoris 1998   No angina since [place]  . History of acute Inferior-Posterior wall MI [id number](in [place]) - BMS PCI RCA (inferoposterior Akinesis  on Echo).  . Hyperlipidemia with target low density lipoprotein (LDL) cholesterol less than 70 mg/dL   . OSA on CPAP    Past Surgical History:  Procedure Laterality Date  . ANKLE FRACTURE SURGERY Right 1980's  . TREADMILL STRESS ECHO  09/17/2017   Pre-stress normal LV function at rest but no inferior posterior akinesis..  Exercised for: 35 min.  7.4 METS --> fair functional capacity.  Inferior posterior wall akinesis at rest consistent with prior MI --> maintained inferior posterior akinesis with exertion.  No evidence of ischemia.   No Known Allergies Prior to Admission medications   Medication Sig Start Date End Date Taking? Authorizing Provider   Ascorbic Acid (VITAMIN C) 1000 MG tablet Take 1,000 mg by mouth daily.   Yes [provider]  aspirin EC 81 MG tablet Take 81 mg by mouth daily.   Yes [provider]  bisoprolol (ZEBETA) 5 MG tablet Take 1 tablet (5 mg total) by mouth daily. 12/30/19  Yes [name], MD  BLACK CURRANT SEED OIL PO Take 1,250 mg by mouth.   Yes [provider]  Multiple Vitamins-Minerals (A THRU Z ULTIMATE MENS PO) Take by mouth.   Yes [provider]  omega-3 acid ethyl esters (LOVAZA) 1 g capsule Take by mouth 2 (two) times daily.   Yes [provider]  Probiotic Product (PROBIOTIC MATURE ADULT PO) Take 94 mg by mouth.   Yes [provider]  rosuvastatin (CRESTOR) 20 MG tablet Take 1 tablet (20 mg total) by mouth daily. 12/30/19  Yes [name], MD  triamcinolone cream (KENALOG) 0.1 % Apply to patches of dry skin 2 times daily as needed 07/21/17  Yes [provider]  Vitamin D, Ergocalciferol, (DRISDOL) 1.25 MG (50000 UNIT) CAPS capsule Take 50,000 Units by mouth every 7 (seven) days.   Yes [provider]  Multiple Vitamin (MULTI-VITAMINS) TABS Take by mouth.    [provider]   Social History   Socioeconomic History  . Marital status: Married    Spouse name: Not on file  . Number of children: 2  . Years of education: 42  . Highest education level: Some college, no degree  Occupational History  . 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 Smoking status: Former Smoker    Types: Cigarettes    Quit date: 07/21/2009    Years since quitting: 10.4  . Smokeless tobacco: Never Used  . Tobacco comment: currenty vapes (tabaco)  Substance and Sexual Activity  . Alcohol use: Not Currently  . Drug use: Never  . Sexual activity: Not on file  Other Topics Concern  . Not on file  Social History Narrative   Married father 2 with 5 grandchildren and 2 great-grandchildren.  He  lives with his current wife of 9 years.   He walks about 30 minutes 5 days a week.   Social Determinants of Health   Financial Resource Strain:   . Difficulty of Paying Living Expenses: Not on file  Food Insecurity:   . Worried About Charity fundraiser in the Last Year: Not on file  . Ran Out of Food in the Last Year: Not on file  Transportation Needs:   . Lack of Transportation (Medical): Not on file  . Lack of Transportation (Non-Medical): Not on file  Physical Activity:   . Days of Exercise per Week: Not on file  . Minutes of Exercise per  Session: Not on file  Stress:   . Feeling of Stress : Not on file  Social Connections:   . Frequency of Communication with Friends and Family: Not on file  . Frequency of Social Gatherings with Friends and Family: Not on file  . Attends Religious Services: Not on file  . Active Member of Clubs or Organizations: Not on file  . Attends Archivist Meetings: Not on file  . Marital Status: Not on file  Intimate Partner Violence:   . Fear of Current or Ex-Partner: Not on file  . Emotionally Abused: Not on file  . Physically Abused: Not on file  . Sexually Abused: Not on file    Review of Systems  Per HPI.  Objective:   Vitals:   01/03/20 0936  BP: 138/83  Pulse: 77  Temp: 98.7 F (37.1 C)  TempSrc: Temporal  SpO2: 96%  Weight: (!) 344 lb (156 kg)  Height: '5\' 11"'  (1.803 m)     Physical Exam Vitals reviewed.  Constitutional:      Appearance: He is well-developed.  HENT:     Head: Normocephalic and atraumatic.     Right Ear: External ear normal.     Left Ear: External ear normal.  Eyes:     Conjunctiva/sclera: Conjunctivae normal.     Pupils: Pupils are equal, round, and reactive to light.  Neck:     Thyroid: No thyromegaly.  Cardiovascular:     Rate and Rhythm: Normal rate and regular rhythm.     Heart sounds: Normal heart sounds.  Pulmonary:     Effort: Pulmonary effort is normal. No respiratory distress.      Breath sounds: Normal breath sounds. No wheezing.  Abdominal:     General: There is no distension.     Palpations: Abdomen is soft.     Tenderness: There is no abdominal tenderness.     Hernia: There is no hernia in the left inguinal area or right inguinal area.  Genitourinary:    Prostate: Normal.  Musculoskeletal:        General: No tenderness. Normal range of motion.     Cervical back: Normal range of motion and neck supple.  Lymphadenopathy:     Cervical: No cervical adenopathy.  Skin:    General: Skin is warm and dry.  Neurological:     Mental Status: He is alert and oriented to person, place, and time.     Deep Tendon Reflexes: Reflexes are normal and symmetric.  Psychiatric:        Behavior: Behavior normal.       Assessment & Plan:  Bruce Williams is a 69 y.o. male . Annual physical exam  - -anticipatory guidance as below in AVS, screening labs above. Health maintenance items as above in HPI discussed/recommended as applicable.   Hyperlipidemia, unspecified hyperlipidemia type - Plan: Lipid panel, Comprehensive metabolic panel, rosuvastatin (CRESTOR) 20 MG tablet  -Tolerating Crestor.  Continue same.  Labs pending  Class 3 severe obesity with serious comorbidity and body mass index (BMI) of 45.0 to 49.9 in adult, unspecified obesity type ([place]) - Plan: Ambulatory referral to [place] on diet choices.  Exercise is limited with his work.  Did want to meet with bariatric surgeon to discuss potential cost and options.  Referral placed.  Coronary artery disease involving native heart without angina pectoris, unspecified vessel or lesion type - Plan: rosuvastatin (CRESTOR) 20 MG tablet, bisoprolol (ZEBETA) 5 MG tablet  -Asymptomatic, follow-up with cardiology recommended.  Continue beta-blocker, statin.  OSA on CPAP  -Compliant, continue OSA.  Essential hypertension  -Stable, continue same regimen.    Meds ordered this encounter  Medications  .  rosuvastatin (CRESTOR) 20 MG tablet    Sig: Take 1 tablet (20 mg total) by mouth daily.    Dispense:  90 tablet    Refill:  2  . bisoprolol (ZEBETA) 5 MG tablet    Sig: Take 1 tablet (5 mg total) by mouth daily.    Dispense:  90 tablet    Refill:  2   Patient Instructions  Thank you for coming in today.  Please schedule appointment with your cardiologist, and I also recommend a dental visit.  I did place a referral to general surgery to discuss weight loss surgery options and potential cost.  Please let me know if there are questions from that visit.  No change in medications today.  I will check some lab work and let you know if there are concerns.  Take care.    Preventive Care 49 Years and Older, Male Preventive care refers to lifestyle choices and visits with your health care provider that can promote health and wellness. This includes:  A yearly physical exam. This is also called an annual well check.  Regular dental and eye exams.  Immunizations.  Screening for certain conditions.  Healthy lifestyle choices, such as diet and exercise. What can I expect for my preventive care visit? Physical exam Your health care provider will check:  Height and weight. These may be used to calculate body mass index (BMI), which is a measurement that tells if you are at a healthy weight.  Heart rate and blood pressure.  Your skin for abnormal spots. Counseling Your health care provider may ask you questions about:  Alcohol, tobacco, and drug use.  Emotional well-being.  Home and relationship well-being.  Sexual activity.  Eating habits.  History of falls.  Memory and ability to understand (cognition).  Work and work Statistician. What immunizations do I need?  Influenza (flu) vaccine  This is recommended every year. Tetanus, diphtheria, and pertussis (Tdap) vaccine  You may need a Td booster every 10 years. Varicella (chickenpox) vaccine  You may need this vaccine  if you have not already been vaccinated. Zoster (shingles) vaccine  You may need this after age 65. Pneumococcal conjugate (PCV13) vaccine  One dose is recommended after age 14. Pneumococcal polysaccharide (PPSV23) vaccine  One dose is recommended after age 29. Measles, mumps, and rubella (MMR) vaccine  You may need at least one dose of MMR if you were born in 1957 or later. You may also need a second dose. Meningococcal conjugate (MenACWY) vaccine  You may need this if you have certain conditions. Hepatitis A vaccine  You may need this if you have certain conditions or if you travel or work in places where you may be exposed to hepatitis A. Hepatitis B vaccine  You may need this if you have certain conditions or if you travel or work in places where you may be exposed to hepatitis B. Haemophilus influenzae type b (Hib) vaccine  You may need this if you have certain conditions. You may receive vaccines as individual doses or as more than one vaccine together in one shot (combination vaccines). Talk with your health care provider about the risks and benefits of combination vaccines. What tests do I need? Blood tests  Lipid and cholesterol levels. These may be checked every 5 years, or more frequently depending  on your overall health.  Hepatitis C test.  Hepatitis B test. Screening  Lung cancer screening. You may have this screening every year starting at age 66 if you have a 30-pack-year history of smoking and currently smoke or have quit within the past 15 years.  Colorectal cancer screening. All adults should have this screening starting at age 35 and continuing until age 38. Your health care provider may recommend screening at age 42 if you are at increased risk. You will have tests every 1-10 years, depending on your results and the type of screening test.  Prostate cancer screening. Recommendations will vary depending on your family history and other risks.  Diabetes  screening. This is done by checking your blood sugar (glucose) after you have not eaten for a while (fasting). You may have this done every 1-3 years.  Abdominal aortic aneurysm (AAA) screening. You may need this if you are a current or former smoker.  Sexually transmitted disease (STD) testing. Follow these instructions at home: Eating and drinking  Eat a diet that includes fresh fruits and vegetables, whole grains, lean protein, and low-fat dairy products. Limit your intake of foods with high amounts of sugar, saturated fats, and salt.  Take vitamin and mineral supplements as recommended by your health care provider.  Do not drink alcohol if your health care provider tells you not to drink.  If you drink alcohol: ? Limit how much you have to 0-2 drinks a day. ? Be aware of how much alcohol is in your drink. In the [place] one drink equals one 12 oz bottle of beer (355 mL), one 5 oz glass of wine (148 mL), or one 1 oz glass of hard liquor (44 mL). Lifestyle  Take daily care of your teeth and gums.  Stay active. Exercise for at least 30 minutes on 5 or more days each week.  Do not use any products that contain nicotine or tobacco, such as cigarettes, e-cigarettes, and chewing tobacco. If you need help quitting, ask your health care provider.  If you are sexually active, practice safe sex. Use a condom or other form of protection to prevent STIs (sexually transmitted infections).  Talk with your health care provider about taking a low-dose aspirin or statin. What's next?  Visit your health care provider once a year for a well check visit.  Ask your health care provider how often you should have your eyes and teeth checked.  Stay up to date on all vaccines. This information is not intended to replace advice given to you by your health care provider. Make sure you discuss any questions you have with your health care provider. Document Revised: 11/05/2018 Document Reviewed:  11/05/2018 Elsevier Patient Education  2020 [employer].      Signed, Merri Ray, MD [facility]

## 2020-01-04 ENCOUNTER — Encounter: Payer: Self-pay | Admitting: Family Medicine

## 2020-01-04 LAB — LIPID PANEL
Chol/HDL Ratio: 2.4 ratio (ref 0.0–5.0)
Cholesterol, Total: 134 mg/dL (ref 100–199)
HDL: 57 mg/dL (ref >39)
LDL Chol Calc (NIH): 65 mg/dL (ref 0–99)
Triglycerides: 55 mg/dL (ref 0–149)
VLDL Cholesterol Cal: 12 mg/dL (ref 5–40)

## 2020-01-04 LAB — COMPREHENSIVE METABOLIC PANEL
ALT: 12 IU/L (ref 0–44)
AST: 12 IU/L (ref 0–40)
Albumin/Globulin Ratio: 1.4 (ref 1.2–2.2)
Albumin: 4 g/dL (ref 3.8–4.8)
Alkaline Phosphatase: 49 IU/L (ref 39–117)
BUN/Creatinine Ratio: 15 (ref 10–24)
BUN: 14 mg/dL (ref 8–27)
Bilirubin Total: 0.4 mg/dL (ref 0.0–1.2)
CO2: 19 mmol/L — ABNORMAL LOW (ref 20–29)
Calcium: 9.4 mg/dL (ref 8.6–10.2)
Chloride: 108 mmol/L — ABNORMAL HIGH (ref 96–106)
Creatinine, Ser: 0.91 mg/dL (ref 0.76–1.27)
GFR calc Af Amer: 101 mL/min/{1.73_m2} (ref >59)
GFR calc non Af Amer: 88 mL/min/{1.73_m2} (ref >59)
Globulin, Total: 2.9 g/dL (ref 1.5–4.5)
Glucose: 85 mg/dL (ref 65–99)
Potassium: 4.7 mmol/L (ref 3.5–5.2)
Sodium: 141 mmol/L (ref 134–144)
Total Protein: 6.9 g/dL (ref 6.0–8.5)

## 2020-01-09 ENCOUNTER — Encounter: Payer: Self-pay | Admitting: Family Medicine

## 2020-01-11 NOTE — Telephone Encounter (Signed)
This likely is form the general surgery office. Please see referral notes: "Sent to [facility] [place] [facility] via proficient  Release id - [id number] Sent pt email with website to sign up for seminar"  I will send him an email with this info and their contact info.

## 2020-01-14 DIAGNOSIS — G4733 Obstructive sleep apnea (adult) (pediatric): Secondary | ICD-10-CM | POA: Diagnosis not present

## 2020-02-11 ENCOUNTER — Encounter: Payer: Medicare HMO | Admitting: Family Medicine

## 2020-02-11 DIAGNOSIS — G4733 Obstructive sleep apnea (adult) (pediatric): Secondary | ICD-10-CM | POA: Diagnosis not present

## 2020-03-13 DIAGNOSIS — G4733 Obstructive sleep apnea (adult) (pediatric): Secondary | ICD-10-CM | POA: Diagnosis not present

## 2020-04-12 DIAGNOSIS — G4733 Obstructive sleep apnea (adult) (pediatric): Secondary | ICD-10-CM | POA: Diagnosis not present

## 2020-05-13 DIAGNOSIS — G4733 Obstructive sleep apnea (adult) (pediatric): Secondary | ICD-10-CM | POA: Diagnosis not present

## 2020-06-12 DIAGNOSIS — G4733 Obstructive sleep apnea (adult) (pediatric): Secondary | ICD-10-CM | POA: Diagnosis not present

## 2020-06-28 ENCOUNTER — Ambulatory Visit (INDEPENDENT_AMBULATORY_CARE_PROVIDER_SITE_OTHER): Payer: Medicare HMO | Admitting: Family Medicine

## 2020-06-28 ENCOUNTER — Other Ambulatory Visit: Payer: Self-pay

## 2020-06-28 DIAGNOSIS — E785 Hyperlipidemia, unspecified: Secondary | ICD-10-CM

## 2020-06-28 LAB — COMPREHENSIVE METABOLIC PANEL
ALT: 16 IU/L (ref 0–44)
AST: 17 IU/L (ref 0–40)
Albumin/Globulin Ratio: 1.6 (ref 1.2–2.2)
Albumin: 4.1 g/dL (ref 3.8–4.8)
Alkaline Phosphatase: 49 IU/L (ref 48–121)
BUN/Creatinine Ratio: 15 (ref 10–24)
BUN: 14 mg/dL (ref 8–27)
Bilirubin Total: 0.3 mg/dL (ref 0.0–1.2)
CO2: 20 mmol/L (ref 20–29)
Calcium: 8.9 mg/dL (ref 8.6–10.2)
Chloride: 105 mmol/L (ref 96–106)
Creatinine, Ser: 0.93 mg/dL (ref 0.76–1.27)
GFR calc Af Amer: 98 mL/min/{1.73_m2} (ref >59)
GFR calc non Af Amer: 85 mL/min/{1.73_m2} (ref >59)
Globulin, Total: 2.6 g/dL (ref 1.5–4.5)
Glucose: 98 mg/dL (ref 65–99)
Potassium: 4.7 mmol/L (ref 3.5–5.2)
Sodium: 140 mmol/L (ref 134–144)
Total Protein: 6.7 g/dL (ref 6.0–8.5)

## 2020-06-28 LAB — LIPID PANEL
Chol/HDL Ratio: 2.2 ratio (ref 0.0–5.0)
Cholesterol, Total: 129 mg/dL (ref 100–199)
HDL: 59 mg/dL (ref >39)
LDL Chol Calc (NIH): 59 mg/dL (ref 0–99)
Triglycerides: 46 mg/dL (ref 0–149)
VLDL Cholesterol Cal: 11 mg/dL (ref 5–40)

## 2020-06-28 NOTE — Patient Instructions (Signed)
° ° ° °  If you have lab work done today you will be contacted with your lab results within the next 2 weeks.  If you have not heard from us then please contact us. The fastest way to get your results is to register for My Chart. ° ° °IF you received an x-ray today, you will receive an invoice from [facility]. Please contact [facility] at [phone] with questions or concerns regarding your invoice.  ° °IF you received labwork today, you will receive an invoice from [facility]. Please contact [facility] at [phone] with questions or concerns regarding your invoice.  ° °Our billing staff will not be able to assist you with questions regarding bills from these companies. ° °You will be contacted with the lab results as soon as they are available. The fastest way to get your results is to activate your My Chart account. Instructions are located on the last page of this paperwork. If you have not heard from us regarding the results in 2 weeks, please contact this office. °  ° ° ° °

## 2020-06-28 NOTE — Progress Notes (Signed)
Pt was here for lab work, 1 tiger top and 1 lavender collected

## 2020-07-03 ENCOUNTER — Telehealth: Payer: Self-pay | Admitting: Cardiology

## 2020-07-03 DIAGNOSIS — I2510 Atherosclerotic heart disease of native coronary artery without angina pectoris: Secondary | ICD-10-CM

## 2020-07-03 DIAGNOSIS — Z0289 Encounter for other administrative examinations: Secondary | ICD-10-CM

## 2020-07-03 NOTE — Telephone Encounter (Signed)
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 Appointment made on 08/01/20 for his 75-month follow up. Please advise.

## 2020-07-03 NOTE — Telephone Encounter (Signed)
Routed to primary MD and nurse to advise on request for stress test. Can this be done before his appointment or should it be ordered when here on 9/7  Thanks

## 2020-07-04 NOTE — Telephone Encounter (Signed)
Per my last note.  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

## 2020-07-05 ENCOUNTER — Ambulatory Visit (INDEPENDENT_AMBULATORY_CARE_PROVIDER_SITE_OTHER): Payer: Medicare HMO | Admitting: Family Medicine

## 2020-07-05 ENCOUNTER — Other Ambulatory Visit: Payer: Self-pay

## 2020-07-05 ENCOUNTER — Encounter: Payer: Self-pay | Admitting: Family Medicine

## 2020-07-05 VITALS — BP 120/76 | HR 74 | Temp 97.5°F | Ht 71.0 in | Wt 348.0 lb

## 2020-07-05 DIAGNOSIS — Z6841 Body Mass Index (BMI) 40.0 and over, adult: Secondary | ICD-10-CM | POA: Diagnosis not present

## 2020-07-05 DIAGNOSIS — R140 Abdominal distension (gaseous): Secondary | ICD-10-CM

## 2020-07-05 DIAGNOSIS — I2510 Atherosclerotic heart disease of native coronary artery without angina pectoris: Secondary | ICD-10-CM

## 2020-07-05 DIAGNOSIS — E785 Hyperlipidemia, unspecified: Secondary | ICD-10-CM

## 2020-07-05 MED ORDER — BISOPROLOL FUMARATE 5 MG PO TABS: 5.0000 mg | ORAL_TABLET | Freq: Every day | ORAL | 2 refills | Status: DC

## 2020-07-05 MED ORDER — ROSUVASTATIN CALCIUM 20 MG PO TABS: 20.0000 mg | ORAL_TABLET | Freq: Every day | ORAL | 2 refills | Status: DC

## 2020-07-05 NOTE — Telephone Encounter (Signed)
From 08/2018 note: Encounter for physical examination related to employment - Primary     He is pending DOT physical on Wednesday, but he does not know where he is actually having to go.  I am not sure what the requirements would be, I think since he had a stress test done last year, he should be fine in the absence of symptoms and would not need to have another evaluation. 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     Left message to call back to discuss. MyChart message also sent.  GXT ordered - will need COVID screening prior

## 2020-07-05 NOTE — Progress Notes (Signed)
Subjective:  Patient ID: Bruce Williams, male    DOB: 06-15-1951  Age: 69 y.o. MRN: [id number]  CC:  Chief Complaint  Patient presents with  . 6 month follow up    hyperlipidemia  . gas and bloating    HPI Ory Elting presents for   Hyperlipidemia: Crestor 20 mg daily. No new side effects, or myalgias.  appt planned with cardiology - hx CAD. No CP/dyspnea.  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  No sodas or sweet teas.  Some restaurant food on the weekends but was cooking at home during the week.  Did provide information for surgical options previously. He does use CPAP for OSA. Weight up 4 pounds since February. Signed up for link for survey for bariatric surgery. dtr had good weight loss with gastric sleeve- he is considering.    Wt Readings from Last 3 Encounters:  07/05/20 (!) 348 lb (157.9 kg)  01/03/20 (!) 344 lb (156 kg)  11/02/19 (!) 335 lb (152 kg)   Body mass index is 48.54 kg/m.  Bloating/increased gas: Started after [employer] probiotic. No change with different  No abd pain/n/v/fever.  Most days. Raw vegetables throughout the day. No change with beano.  No carbonated beverages.  No abd distension.   History Patient Active Problem List   Diagnosis Date Noted  . Essential hypertension 08/31/2018  . Irregular heart beats 08/31/2018  . Encounter for physical examination related to employment 08/31/2018  . Coronary artery disease involving native heart without angina pectoris   . Hyperlipidemia with target LDL less than 70    Past Medical History:  Diagnosis Date  . Coronary artery disease involving native heart without angina  pectoris 1998   No angina since MI in [place] Echo 08/2017  . [place] [address]   [place] - BMS PCI RCA (inferoposterior Akinesis on Echo).  . Hyperlipidemia with target low density lipoprotein (LDL) cholesterol less than 70 mg/dL   . OSA on CPAP    Past Surgical History:  Procedure Laterality Date  . ANKLE FRACTURE SURGERY Right 1980's  . TREADMILL STRESS ECHO  09/17/2017   Pre-stress normal LV function at rest but no inferior posterior akinesis..  Exercised for: 35 min.  7.4 METS --> fair functional capacity.  Inferior posterior wall akinesis at rest consistent with prior MI --> maintained inferior posterior akinesis with exertion.  No evidence of ischemia.   No Known Allergies Prior to Admission medications   Medication Sig Start Date End Date Taking? Authorizing Provider  Ascorbic Acid (VITAMIN C) 1000 MG tablet Take 1,000 mg by mouth daily.   Yes [provider]  aspirin EC 81 MG tablet Take 81 mg by mouth daily.   Yes [provider]  bisoprolol (ZEBETA) 5 MG tablet Take 1 tablet (5 mg total) by mouth daily. 01/03/20  Yes [name], MD  BLACK CURRANT SEED OIL PO Take 1,250 mg by mouth.   Yes [provider]  co-enzyme Q-10 30 MG capsule Take 30 mg by mouth 3 (three) times daily.  Yes [provider]  Multiple Vitamin (MULTI-VITAMINS) TABS Take by mouth.   Yes [provider]  Multiple Vitamins-Minerals (A THRU Z ULTIMATE MENS PO) Take by mouth.   Yes [provider]  Probiotic Product (PROBIOTIC MATURE ADULT PO) Take 94 mg by mouth.   Yes [provider]  rosuvastatin (CRESTOR) 20 MG tablet Take 1 tablet (20 mg total) by mouth daily. 01/03/20  Yes [name], MD  triamcinolone cream (KENALOG) 0.1 % Apply to patches of dry skin 2 times daily as needed 07/21/17  Yes [provider]  TURMERIC PO Take by mouth.   Yes [provider]  Vitamin D,  Ergocalciferol, (DRISDOL) 1.25 MG (50000 UNIT) CAPS capsule Take 50,000 Units by mouth every 7 (seven) days.   Yes [provider]   Social History   Socioeconomic History  . Marital status: Married    Spouse name: Not on file  . Number of children: 2  . Years of education: 6  . Highest education level: Some college, no degree  Occupational History  . 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 Smoking status: Former Smoker    Types: Cigarettes    Quit date: 07/21/2009    Years since quitting: 10.9  . Smokeless tobacco: Never Used  . Tobacco comment: currenty vapes (tabaco)  Substance and Sexual Activity  . Alcohol use: Not Currently  . Drug use: Never  . Sexual activity: Not on file  Other Topics Concern  . Not on file  Social History Narrative   Married father 2 with 5 grandchildren and 2 great-grandchildren.  He lives with his current wife of 9 years.   He walks about 30 minutes 5 days a week.   Social Determinants of Health   Financial Resource Strain:   . Difficulty of Paying Living Expenses:   Food Insecurity:   . Worried About Charity fundraiser in the Last Year:   . Arboriculturist in the Last Year:   Transportation Needs:   . Film/video editor (Medical):   Marland Kitchen Lack of Transportation (Non-Medical):   Physical Activity:   . Days of Exercise per Week:   . Minutes of Exercise per Session:   Stress:   . Feeling of Stress :   Social Connections:   . Frequency of Communication with Friends and Family:   . Frequency of Social Gatherings with Friends and Family:   . Attends Religious Services:   . Active Member of Clubs or Organizations:   . Attends Archivist Meetings:   Marland Kitchen Marital Status:   Intimate Partner Violence:   . Fear of Current or Ex-Partner:   . Emotionally Abused:   Marland Kitchen Physically Abused:   . Sexually Abused:     Review of Systems  Constitutional: Negative for fatigue and  unexpected weight change.  Eyes: Negative for visual disturbance.  Respiratory: Negative for cough, chest tightness and shortness of breath.   Cardiovascular: Negative for chest pain, palpitations and leg swelling.  Gastrointestinal: Negative for abdominal pain and blood in stool.  Neurological: Negative for dizziness, light-headedness and headaches.     Objective:   Vitals:   07/05/20 0806  BP: 120/76  Pulse: 74  Temp: (!) 97.5 F (36.4 C)  SpO2: 96%  Weight: (!) 348 lb (157.9 kg)  Height: 5\' 11"  (1.803 m)     Physical Exam Vitals reviewed.  Constitutional:  Appearance: He is well-developed. He is obese.  HENT:     Head: Normocephalic and atraumatic.  Eyes:     Pupils: Pupils are equal, round, and reactive to light.  Neck:     Vascular: No carotid bruit or JVD.  Cardiovascular:     Rate and Rhythm: Normal rate and regular rhythm.     Heart sounds: Normal heart sounds. No murmur heard.   Pulmonary:     Effort: Pulmonary effort is normal.     Breath sounds: Normal breath sounds. No rales.  Abdominal:     General: Abdomen is flat. There is no distension.     Tenderness: There is no abdominal tenderness. There is no guarding.  Skin:    General: Skin is warm and dry.  Neurological:     Mental Status: He is alert and oriented to person, place, and time.        Assessment & Plan:  Remigio Mcmillon is a 69 y.o. male . Class 3 severe obesity with serious comorbidity and body mass index (BMI) of 45.0 to 49.9 in adult, unspecified obesity type (HCC)  -Weight has increased slightly.  He is considering surgical weight loss options.  Advised to discuss any specific questions regarding this procedures with bariatric surgeon.  Commended on good food choices.  Increased gas, bloating  -May be related to his supplements or specific foods including fruits and specific vegetables.  Handout given on FODMAPs, can decrease certain trigger foods 1 at a time to see if this may be  contributing.  RTC precautions but no abdominal pain, nausea, vomiting.   Hyperlipidemia, unspecified hyperlipidemia type - Plan: rosuvastatin (CRESTOR) 20 MG tablet  -  Stable, tolerating current regimen. Medications refilled. Labs pending as above.   Coronary artery disease involving native heart without angina pectoris, unspecified vessel or lesion type - Plan: bisoprolol (ZEBETA) 5 MG tablet, rosuvastatin (CRESTOR) 20 MG tablet  -Asymptomatic, has follow-up with cardiology planned.  Continue same med regimen  Meds ordered this encounter  Medications  . bisoprolol (ZEBETA) 5 MG tablet    Sig: Take 1 tablet (5 mg total) by mouth daily.    Dispense:  90 tablet    Refill:  2  . rosuvastatin (CRESTOR) 20 MG tablet    Sig: Take 1 tablet (20 mg total) by mouth daily.    Dispense:  90 tablet    Refill:  2   Patient Instructions     I do recommend meeting with general surgery if any questions regarding surgical weight loss procedures.   No med changes for now. See list of foods that may cause increased gas/bloating. Return to the clinic or go to the nearest emergency room if any of your symptoms worsen or new symptoms occur.  No med changes for now. Keep follow up with cardiology as planned.    If you have lab work done today you will be contacted with your lab results within the next 2 weeks.  If you have not heard from [place] then please contact us. The fastest way to get your results is to register for My Chart.   IF you received an x-ray today, you will receive an invoice from [facility]. Please contact [facility] at [phone] with questions or concerns regarding your invoice.   IF you received labwork today, you will receive an invoice from [facility]. Please contact [facility] at [phone] with questions or concerns regarding your invoice.   Our billing staff will not be able to assist you  with questions regarding bills from these companies.  You will be  contacted with the lab results as soon as they are available. The fastest way to get your results is to activate your My Chart account. Instructions are located on the last page of this paperwork. If you have not heard from [place] regarding the results in 2 weeks, please contact this office.          Signed, Merri Ray, MD [facility]

## 2020-07-05 NOTE — Patient Instructions (Addendum)
° °  I do recommend meeting with general surgery if any questions regarding surgical weight loss procedures.   No med changes for now. See list of foods that may cause increased gas/bloating. Return to the clinic or go to the nearest emergency room if any of your symptoms worsen or new symptoms occur.  No med changes for now. Keep follow up with cardiology as planned.    If you have lab work done today you will be contacted with your lab results within the next 2 weeks.  If you have not heard from [place] then please contact us. The fastest way to get your results is to register for My Chart.   IF you received an x-ray today, you will receive an invoice from [facility]iology. Please contact [facility] Radiology at [phone] with questions or concerns regarding your invoice.   IF you received labwork today, you will receive an invoice from [facility]. Please contact [facility] at [phone] with questions or concerns regarding your invoice.   Our billing staff will not be able to assist you with questions regarding bills from these companies.  You will be contacted with the lab results as soon as they are available. The fastest way to get your results is to activate your My Chart account. Instructions are located on the last page of this paperwork. If you have not heard from [place] regarding the results in 2 weeks, please contact this office.

## 2020-07-06 ENCOUNTER — Telehealth (HOSPITAL_COMMUNITY): Payer: Self-pay | Admitting: Cardiology

## 2020-07-06 NOTE — Telephone Encounter (Signed)
I called patient to schedule GXT and explained that he will need COVID testing and to quarantine before appt.  He states he is not against the testing but he cant miss nay work.  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

## 2020-07-07 NOTE — Telephone Encounter (Signed)
I agree with Hildred Alamin that I do not know what the insurance company will cover.  GXT is a reasonable cost, but [employer] is a significant cost and may also be read as abnormal and he may need more testing.  Unless [employer] decides that they have a way of reimbursing people for missing days of work because of this mandate, our hands are very much tied on how we take care of patients.  Asking people to stay out of work for 3 days is not fair just to have a treadmill stress test.    If insurance will cover Lexiscan, then we should order that, but we may need to figure a way to push it through (citing [place] conditions).   Glenetta Hew, MD

## 2020-07-10 NOTE — Telephone Encounter (Signed)
Patient will be removed from the [place] due to patient declined to have test due to [place].

## 2020-07-12 ENCOUNTER — Telehealth: Payer: Self-pay | Admitting: Cardiology

## 2020-07-12 NOTE — Telephone Encounter (Signed)
The patient has an OV scheduled on the morning of 9/7 but is concerned because he is supposed to be quarantining for his stress test that is the afternoon of 9/7. Will route to Dr. Ellyn Hack to see if patient can still attend OV the morning of or if the appointment needs to be changed.

## 2020-07-12 NOTE — Telephone Encounter (Signed)
    Pt is calling he is not sure if its ok to have his appt with Dr. Ellyn Hack before the stress test

## 2020-07-13 DIAGNOSIS — G4733 Obstructive sleep apnea (adult) (pediatric): Secondary | ICD-10-CM | POA: Diagnosis not present

## 2020-07-13 NOTE — Telephone Encounter (Signed)
I would imagine that is OK - not sure what current rules are.  However, it would also make more sense to see him after his Stress Test so we can discuss results. ..  Glenetta Hew, MD

## 2020-07-27 ENCOUNTER — Telehealth (HOSPITAL_COMMUNITY): Payer: Self-pay

## 2020-07-27 NOTE — Telephone Encounter (Signed)
Encounter complete. 

## 2020-07-29 ENCOUNTER — Other Ambulatory Visit (HOSPITAL_COMMUNITY)
Admission: RE | Admit: 2020-07-29 | Discharge: 2020-07-29 | Disposition: A | Discharge status: Home / Self Care | Payer: Medicare HMO | Source: Ambulatory Visit | Attending: Cardiology | Admitting: Cardiology

## 2020-07-29 DIAGNOSIS — Z20822 Contact with and (suspected) exposure to covid-19: Secondary | ICD-10-CM | POA: Insufficient documentation

## 2020-07-29 DIAGNOSIS — Z01812 Encounter for preprocedural laboratory examination: Secondary | ICD-10-CM | POA: Insufficient documentation

## 2020-07-29 LAB — SARS CORONAVIRUS 2 (TAT 6-24 HRS): SARS Coronavirus 2: NEGATIVE

## 2020-08-01 ENCOUNTER — Other Ambulatory Visit: Payer: Self-pay

## 2020-08-01 ENCOUNTER — Encounter: Payer: Self-pay | Admitting: Cardiology

## 2020-08-01 ENCOUNTER — Ambulatory Visit (INDEPENDENT_AMBULATORY_CARE_PROVIDER_SITE_OTHER): Payer: Medicare HMO | Admitting: Cardiology

## 2020-08-01 ENCOUNTER — Ambulatory Visit (HOSPITAL_COMMUNITY)
Admission: RE | Admit: 2020-08-01 | Discharge: 2020-08-01 | Disposition: A | Discharge status: Home / Self Care | Payer: Medicare HMO | Source: Ambulatory Visit | Attending: Cardiovascular Disease | Admitting: Cardiovascular Disease

## 2020-08-01 VITALS — BP 134/80 | HR 77 | Ht 71.0 in | Wt 347.4 lb

## 2020-08-01 DIAGNOSIS — I10 Essential (primary) hypertension: Secondary | ICD-10-CM

## 2020-08-01 DIAGNOSIS — I2510 Atherosclerotic heart disease of native coronary artery without angina pectoris: Secondary | ICD-10-CM

## 2020-08-01 DIAGNOSIS — R9439 Abnormal result of other cardiovascular function study: Secondary | ICD-10-CM | POA: Diagnosis not present

## 2020-08-01 DIAGNOSIS — Z0289 Encounter for other administrative examinations: Secondary | ICD-10-CM | POA: Diagnosis not present

## 2020-08-01 DIAGNOSIS — E785 Hyperlipidemia, unspecified: Secondary | ICD-10-CM | POA: Diagnosis not present

## 2020-08-01 DIAGNOSIS — I499 Cardiac arrhythmia, unspecified: Secondary | ICD-10-CM | POA: Diagnosis not present

## 2020-08-01 HISTORY — PX: OTHER SURGICAL HISTORY: SHX169

## 2020-08-01 LAB — EXERCISE TOLERANCE TEST
Estimated workload: 5.3 METS
Exercise duration (min): 3 min
Exercise duration (sec): 36 s
MPHR: 153 {beats}/min
Peak HR: 137 {beats}/min
Percent HR: 89 %
Rest HR: 81 {beats}/min

## 2020-08-01 NOTE — Progress Notes (Signed)
Primary Care Provider: [name], MD Cardiologist: Glenetta Hew, MD Electrophysiologist: None  Clinic Note: Chief Complaint  Patient presents with  . Follow-up    2 years  . Coronary Artery Disease    No angina  . Employment Physical    Pending DOT evaluation    HPI:    Bruce Williams is a 69 y.o. male with a PMH notable for CAD having PCI in 1998, HTN, HLD and morbid obesity with OSA-CPAP who presents today for 2-year follow-up for stress test as part of DOT physical at the request of [name], MD.  Bruce Williams has a distant history of MI with a PCI to the RCA back in 1998 while living in [place].  He also has hypertension hyperlipidemia and obesity with OSA on CPAP. 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  Bruce Williams was seen for his 1 visit with me on August 31, 2018 at the request of Dr. Carlota Raspberry -> he had been seen on August 27 that year to initiate care with a new PCP.  He was transferring his care to [facility] system, and asked to transfer to a [facility] Peter.  Prior to that he has been followed by Dr. Rozann Lesches at [facility] -> had had a stress echo for DOT physical on September 11, 2017.  Recent Hospitalizations: None  Reviewed  CV studies:    The following studies were reviewed today: (if available, images/films reviewed: From Epic Chart or Care Everywhere) He had a GXT ordered for afternoon following this visit. -Summarized:  GXT (08/01/2020): Exercised for 3: 36 min (5.3 METS).  Hypertensive response to exercise-reached to 225/77 mmHg.  Unable to interpret due to baseline ST and T wave abnormalities.   Interval History:   Bruce Williams is here today for routine follow-up.  He does not exercise, indicating he does not have time as a truck driver.  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   He acknowledges that he does not eat as appropriate he should, but denies any sodas or sweet teas.  However he has spends a lot of time eating restaurant foods during the weekdays.  He is currently under the process of being evaluated for bariatric surgery, his daughter actually just went through gastric sleeve surgery.  With the amount of activity that he does, he does get some exertional dyspnea simply because of his obesity, but denies any chest pain or pressure with rest or exertion.  Denies any PND or orthopnea-mostly because he sleeps somewhat inclined because of GERD and has to use CPAP.  Mild lower extremity edema which is stable.  No irregular heartbeats palpitations.   Cardiovascular Review of Symptoms (Summary): positive for - dyspnea on exertion, edema, orthopnea and Mostly all related to deconditioning and obesity negative for - chest pain, irregular heartbeat, palpitations, paroxysmal nocturnal dyspnea, rapid heart rate, shortness of breath or Lightheadedness, dizziness or syncope/near syncope, TIA/amaurosis fugax; claudication  The patient does not have symptoms concerning for COVID-19 infection (fever, chills, cough, or new shortness of breath).   REVIEWED OF SYSTEMS   Review of Systems  Constitutional: Negative for malaise/fatigue and weight loss.  HENT: Positive for congestion.   Respiratory: Negative for shortness of breath (Only with exertion).   Gastrointestinal: Negative for blood in stool (Recent hemorrhoid, last evaluated in February 2021) and melena.  Genitourinary: Negative for hematuria.  Musculoskeletal: Positive for back pain and joint pain.  Neurological: Negative for dizziness, focal weakness and headaches.  Psychiatric/Behavioral: Negative for depression (A little bit down, but not depressed).   I have reviewed and (if needed) personally updated the patient's problem list, medications, allergies, past medical and surgical history, social and family history.    PAST MEDICAL HISTORY   Past Medical History:  Diagnosis Date  . Coronary artery disease involving native heart without angina pectoris 1998   No angina since MI in [place] Echo 08/2017  . [place] [address]   [place] - BMS PCI RCA (inferoposterior Akinesis on Echo).  . Hyperlipidemia with target low density lipoprotein (LDL) cholesterol less than 70 mg/dL   . OSA on CPAP     PAST SURGICAL HISTORY   Past Surgical History:  Procedure Laterality Date  . ANKLE FRACTURE SURGERY Right 1980's  . TREADMILL STRESS ECHO  09/17/2017   Pre-stress normal LV function at rest but no inferior posterior akinesis..  Exercised for: 35 min.  7.4 METS --> fair functional capacity.  Inferior posterior wall akinesis at rest consistent with prior MI --> maintained inferior posterior akinesis with exertion.  No evidence of ischemia.    Immunization History  Administered Date(s) Administered  . Influenza,inj,Quad PF,6+ Mos 09/26/2016  . Influenza-Unspecified 09/13/2015  --> He declines getting COVID-19 vaccine  MEDICATIONS/ALLERGIES   Current Meds  Medication Sig  . Ascorbic Acid (VITAMIN C) 1000 MG tablet Take 1,000 mg by mouth daily.  Marland Kitchen aspirin EC 81 MG tablet Take 81 mg by mouth daily.  . bisoprolol (ZEBETA) 5 MG tablet Take 1 tablet (5 mg total) by mouth daily.  Marland Kitchen BLACK CURRANT SEED OIL PO Take 1,250 mg by mouth.  . co-enzyme Q-10 30 MG capsule Take 30 mg by mouth 3 (three) times daily.  . Multiple Vitamin (MULTI-VITAMINS) TABS Take by mouth.  . Multiple Vitamins-Minerals (A THRU Z ULTIMATE MENS PO) Take by mouth.  . Probiotic Product (PROBIOTIC MATURE ADULT PO) Take 94 mg by mouth.  . rosuvastatin (CRESTOR) 20 MG tablet Take 1 tablet (20 mg total) by mouth daily.  Marland Kitchen triamcinolone cream (KENALOG) 0.1 % Apply to patches of dry skin 2 times daily as needed  . TURMERIC PO Take by mouth.  . Vitamin D, Ergocalciferol, (DRISDOL) 1.25 MG (50000 UNIT)  CAPS capsule Take 50,000 Units by mouth every 7 (seven) days.    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  Not yet walking 30 minutes 5 days a week that he used to do.  OBJCTIVE -PE, EKG, labs   Wt Readings from Last 3 Encounters:  08/01/20 (!) 347 lb 6.4 oz (157.6 kg)  07/05/20 (!) 348 lb (157.9 kg)  01/03/20 (!) 344 lb (156 kg)    Physical Exam: BP 134/80   Pulse 77   Ht 5\' 11"  (1.803 m)   Wt (!) 347 lb 6.4 oz (157.6 kg)   SpO2 97%   BMI 48.45 kg/m  Physical Exam Vitals reviewed.  Constitutional:      General: He is not in acute distress.    Appearance: He is not ill-appearing.     Comments: super morbidly obese; well-groomed  HENT:     Head: Normocephalic and atraumatic.  Neck:  Vascular: No carotid bruit, hepatojugular reflux or JVD (Difficult assess due to body habitus).  Cardiovascular:     Rate and Rhythm: Normal rate and regular rhythm.     Pulses: Decreased pulses (Diminished due to body habitus.).     Heart sounds: S1 normal and S2 normal. Heart sounds are distant. No murmur heard.  No gallop. No S4 sounds.   Pulmonary:     Effort: Pulmonary effort is normal. No respiratory distress.     Breath sounds: No wheezing or rales.     Comments: Distant breath sounds Chest:     Chest wall: No tenderness.  Abdominal:     General: Abdomen is flat. Bowel sounds are normal. There is no distension.     Palpations: There is no mass (Unable to palpate).     Comments: Obese.  Unable to assess HSM  Musculoskeletal:        General: Swelling (Trivial ankle edema) present. Normal range of motion.     Cervical back: Normal range of motion and neck supple.  Neurological:     General: No focal deficit present.     Mental Status: He is alert and oriented to person, place, and time.  Psychiatric:         Mood and Affect: Mood normal.        Behavior: Behavior normal.        Thought Content: Thought content normal.        Judgment: Judgment normal.      Adult ECG Report  Rate: 77 ;  Rhythm: normal sinus rhythm and Inferior MI, age undetermined.  No significant change;   Narrative Interpretation: Relatively stable  Recent Labs:    Lab Results  Component Value Date   CHOL 129 06/28/2020   HDL 59 06/28/2020   LDLCALC 59 06/28/2020   TRIG 46 06/28/2020   CHOLHDL 2.2 06/28/2020   Lab Results  Component Value Date   CREATININE 0.93 06/28/2020   BUN 14 06/28/2020   NA 140 06/28/2020   K 4.7 06/28/2020   CL 105 06/28/2020   CO2 20 06/28/2020   No results found for: TSH  ASSESSMENT/PLAN    Problem List Items Addressed This Visit    Coronary artery disease involving native heart without angina pectoris (Chronic)    Not very active, recent stress test 2018 was nonischemic.  Today he is scheduled to have a GXT.  I waited for the results to come back and apparently he only walked little over 3 minutes and had significant EKG changes at baseline making the study not interpretable.  He is on aspirin statin and beta-blocker with well-controlled blood pressures.  Unfortunately, his GXT was non-diagnostic -->will convert to [employer]      Relevant Orders   EKG 12-Lead (Completed)   Hyperlipidemia with target LDL less than 70 (Chronic)    Most recent lipids look well controlled on current dose of rosuvastatin.      Essential hypertension (Chronic)    He is on 5 mg bisoprolol.  Blood pressure is borderline control.  Continue to monitor, and low threshold to consider increasing dose versus adding ARB/ACE inhibitor.      Irregular heart beats (Chronic)    No major complaints this now.  Beta-blocker.      Relevant Orders   EKG 12-Lead (Completed)   Encounter for physical examination related to employment - Primary    Unfortunately, his treadmill stress test was not  adequate and we did convert [employer]  As you know he is not having active symptoms.      Relevant Orders   EKG 12-Lead (Completed)   Abnormal stress electrocardiogram test using treadmill    This was not a part of his DOT physical evaluation.  He is asymptomatic, but unfortunately there was uninterpretable EKG and very poor exercise tolerance. I do not think a treadmill stress test or treadmill stress echo/nuclear stress test would be reasonable option. Plan: We will convert to [facility] as opposed to coronary CTA because of his prior stent.        Pending results of converting stress test him my see my review, he may need to be seen following the test to discuss results, otherwise can follow-up in 1 year.  COVID-19 Education: The signs and symptoms of COVID-19 were discussed with the patient and how to seek care for testing (follow up with PCP or arrange E-visit).   The importance of social distancing and COVID-19 vaccination was discussed today. 3 min The patient is practicing social distancing & Masking.    I spent a total of 26minutes with the patient spent in direct patient consultation.  Additional time spent with chart review  / charting (studies, outside notes, etc): 15 Total Time: 32 min   Current medicines are reviewed at length with the patient today.  (+/- concerns) n/a  Notice: This dictation was prepared with Dragon dictation along with smaller phrase technology. Any transcriptional errors that result from this process are unintentional and may not be corrected upon review.  Patient Instructions / Medication Changes & Studies & Tests Ordered   Patient Instructions  Medication Instructions:   No changes  *If you need a refill on your cardiac medications before your next appointment, please call your pharmacy*   Lab Work: Not needed   Testing/Procedures:  keep appointment for this afternoon for exercise tolerance test   Follow-Up: At [facility], you and your health needs are our priority.  As part of our continuing mission to provide you with exceptional heart care, we have created designated Provider Care Teams.  These Care Teams include your primary Cardiologist (physician) and Advanced Practice Providers (APPs -  Physician Assistants and Nurse Practitioners) who all work together to provide you with the care you need, when you need it.    Your next appointment:   12 month(s)  The format for your next appointment:   In Person  Provider:   Glenetta Hew, MD     Studies Ordered:   Orders Placed This Encounter  Procedures  . EKG 12-Lead     Glenetta Hew, M.D., M.S. Interventional Cardiologist   Pager # [phone] Phone # [phone] [address] [place], [place] [address]   Thank you for choosing [facility] at [facility]!!

## 2020-08-01 NOTE — Patient Instructions (Signed)
Medication Instructions:   No changes  *If you need a refill on your cardiac medications before your next appointment, please call your pharmacy*   Lab Work: Not needed   Testing/Procedures:  keep appointment for this afternoon for exercise tolerance test   Follow-Up: At [facility], you and your health needs are our priority.  As part of our continuing mission to provide you with exceptional heart care, we have created designated Provider Care Teams.  These Care Teams include your primary Cardiologist (physician) and Advanced Practice Providers (APPs -  Physician Assistants and Nurse Practitioners) who all work together to provide you with the care you need, when you need it.    Your next appointment:   12 month(s)  The format for your next appointment:   In Person  Provider:   Glenetta Hew, MD

## 2020-08-05 DIAGNOSIS — I2510 Atherosclerotic heart disease of native coronary artery without angina pectoris: Secondary | ICD-10-CM

## 2020-08-07 ENCOUNTER — Encounter: Payer: Self-pay | Admitting: Cardiology

## 2020-08-07 DIAGNOSIS — R9439 Abnormal result of other cardiovascular function study: Secondary | ICD-10-CM | POA: Insufficient documentation

## 2020-08-07 NOTE — Assessment & Plan Note (Signed)
He is on 5 mg bisoprolol.  Blood pressure is borderline control.  Continue to monitor, and low threshold to consider increasing dose versus adding ARB/ACE inhibitor.

## 2020-08-07 NOTE — Assessment & Plan Note (Signed)
This was not a part of his DOT physical evaluation.  He is asymptomatic, but unfortunately there was uninterpretable EKG and very poor exercise tolerance. I do not think a treadmill stress test or treadmill stress echo/nuclear stress test would be reasonable option. Plan: We will convert to [facility] as opposed to coronary CTA because of his prior stent.

## 2020-08-07 NOTE — Assessment & Plan Note (Signed)
Unfortunately, his treadmill stress test was not adequate and we did convert Lexiscan Myoview  As you know he is not having active symptoms.

## 2020-08-07 NOTE — Assessment & Plan Note (Signed)
Most recent lipids look well controlled on current dose of rosuvastatin.

## 2020-08-07 NOTE — Assessment & Plan Note (Signed)
No major complaints this now.  Beta-blocker.

## 2020-08-07 NOTE — Assessment & Plan Note (Addendum)
Not very active, recent stress test 2018 was nonischemic.  Today he is scheduled to have a GXT.  I waited for the results to come back and apparently he only walked little over 3 minutes and had significant EKG changes at baseline making the study not interpretable.  He is on aspirin statin and beta-blocker with well-controlled blood pressures.  Unfortunately, his GXT was non-diagnostic -->will convert to [employer]

## 2020-08-11 ENCOUNTER — Telehealth: Payer: Self-pay | Admitting: Cardiology

## 2020-08-11 NOTE — Telephone Encounter (Signed)
   June, Sarah O, RN  P Cv Div Nl Scheduling Please schedule pt for a lexiscan asap per dr.harding. thank you!   08/08/2020 8:35 am - LVM to schedule lexiscan - AH   08/09/2020 1:58 pm - LVM to schedule lexiscan - AH   08/11/2011 9:44 am - LVM to schedule lexiscan - AH    Called pt 3x to schedule lexiscan

## 2020-08-13 DIAGNOSIS — G4733 Obstructive sleep apnea (adult) (pediatric): Secondary | ICD-10-CM | POA: Diagnosis not present

## 2020-08-16 ENCOUNTER — Telehealth: Payer: Self-pay | Admitting: Cardiology

## 2020-08-16 ENCOUNTER — Encounter: Payer: Self-pay | Admitting: Cardiology

## 2020-08-16 NOTE — Telephone Encounter (Signed)
Spoke with patient regarding 2 day myoview study ordered by Dr. Luisa Hart is to be scheduled at [facility] location.  Patient questioned the 2 day study--explained to patient that due to his weight,  this was to ensure we received "good " pictures from the study.  Will mail information to patient.  He voiced his understanding.

## 2020-08-17 ENCOUNTER — Ambulatory Visit: Payer: PRIVATE HEALTH INSURANCE | Admitting: Cardiology

## 2020-08-23 ENCOUNTER — Telehealth (HOSPITAL_COMMUNITY): Payer: Self-pay | Admitting: *Deleted

## 2020-08-23 ENCOUNTER — Encounter (HOSPITAL_COMMUNITY): Payer: Self-pay | Admitting: *Deleted

## 2020-08-23 NOTE — Telephone Encounter (Signed)
Attempted to leave a  message on voicemail in reference to upcoming appointment scheduled for 08/30/2020. Answering machine would not record message. Mychart letter sent with instructions. Bruce Williams, Ranae Palms

## 2020-08-30 ENCOUNTER — Ambulatory Visit (HOSPITAL_COMMUNITY): Payer: Medicare HMO | Attending: Internal Medicine

## 2020-08-30 ENCOUNTER — Other Ambulatory Visit: Payer: Self-pay

## 2020-08-30 DIAGNOSIS — I2510 Atherosclerotic heart disease of native coronary artery without angina pectoris: Secondary | ICD-10-CM | POA: Diagnosis not present

## 2020-08-30 HISTORY — PX: NM MYOVIEW LTD: HXRAD82

## 2020-08-30 MED ORDER — REGADENOSON 0.4 MG/5ML IV SOLN
0.4000 mg | Freq: Once | INTRAVENOUS | Status: AC
  Administered 2020-08-30: 0.4 mg via INTRAVENOUS

## 2020-08-30 MED ORDER — TECHNETIUM TC 99M TETROFOSMIN IV KIT
33.0000 | PACK | Freq: Once | INTRAVENOUS | Status: AC | PRN
  Administered 2020-08-30: 33 via INTRAVENOUS
  Filled 2020-08-30: qty 33

## 2020-08-31 ENCOUNTER — Other Ambulatory Visit: Payer: Self-pay

## 2020-08-31 ENCOUNTER — Ambulatory Visit (HOSPITAL_COMMUNITY): Payer: Medicare HMO | Attending: Cardiology

## 2020-08-31 LAB — MYOCARDIAL PERFUSION IMAGING
LV dias vol: 111 mL (ref 62–150)
LV sys vol: 59 mL
Peak HR: 97 {beats}/min
Rest HR: 82 {beats}/min
SDS: 0
SRS: 15
SSS: 16
TID: 1.01

## 2020-08-31 MED ORDER — TECHNETIUM TC 99M TETROFOSMIN IV KIT
32.2000 | PACK | Freq: Once | INTRAVENOUS | Status: AC | PRN
  Administered 2020-08-31: 32.2 via INTRAVENOUS
  Filled 2020-08-31: qty 33

## 2020-09-29 DIAGNOSIS — G4733 Obstructive sleep apnea (adult) (pediatric): Secondary | ICD-10-CM | POA: Diagnosis not present

## 2020-10-29 DIAGNOSIS — G4733 Obstructive sleep apnea (adult) (pediatric): Secondary | ICD-10-CM | POA: Diagnosis not present

## 2020-11-29 DIAGNOSIS — G4733 Obstructive sleep apnea (adult) (pediatric): Secondary | ICD-10-CM | POA: Diagnosis not present

## 2021-01-04 ENCOUNTER — Encounter: Payer: Medicare HMO | Admitting: Family Medicine

## 2021-02-09 ENCOUNTER — Telehealth (INDEPENDENT_AMBULATORY_CARE_PROVIDER_SITE_OTHER): Payer: Medicare Other | Admitting: Family Medicine

## 2021-02-09 DIAGNOSIS — Z0001 Encounter for general adult medical examination with abnormal findings: Secondary | ICD-10-CM

## 2021-02-09 DIAGNOSIS — G4733 Obstructive sleep apnea (adult) (pediatric): Secondary | ICD-10-CM

## 2021-02-09 DIAGNOSIS — Z9989 Dependence on other enabling machines and devices: Secondary | ICD-10-CM

## 2021-02-09 DIAGNOSIS — I2510 Atherosclerotic heart disease of native coronary artery without angina pectoris: Secondary | ICD-10-CM | POA: Diagnosis not present

## 2021-02-09 DIAGNOSIS — E785 Hyperlipidemia, unspecified: Secondary | ICD-10-CM | POA: Diagnosis not present

## 2021-02-09 DIAGNOSIS — Z0000 Encounter for general adult medical examination without abnormal findings: Secondary | ICD-10-CM

## 2021-02-09 MED ORDER — ROSUVASTATIN CALCIUM 20 MG PO TABS: 20.0000 mg | ORAL_TABLET | Freq: Every day | ORAL | 2 refills | Status: DC

## 2021-02-09 MED ORDER — BISOPROLOL FUMARATE 5 MG PO TABS: 5.0000 mg | ORAL_TABLET | Freq: Every day | ORAL | 2 refills | Status: DC

## 2021-02-09 NOTE — Patient Instructions (Addendum)
Keep a record of your blood pressures outside of the office and if running over 140/90 let me know.  No med changes at this time.   We will try to get a copy of your last colonoscopy, but if you find out the name of your previous gastroenterologist let us know so we can request that record.  Follow-up with me in my new practice within the next 6 weeks if possible so we can discuss the blood in the urine.  If you notice any blood in the urine or new urinary symptoms during that time be seen right away.  You can also see my colleague sooner if needed at that office to evaluate the blood in the urine further.  Let me know if there are questions.   Check with your pharmacy about the shingles vaccine.   We will send you information about the advanced directives  Follow-up with the eye care provider as planned.   Return to the clinic or go to the nearest emergency room if any of your symptoms worsen or new symptoms occur.   Here are a few lab drawing stations for you to have your labwork performed:  [place], [place], [place] [id number]  [facility] [address] [place], [place] [address]    Preventive Care 65 Years and Older, Male Preventive care refers to lifestyle choices and visits with your health care provider that can promote health and wellness. This includes:  A yearly physical exam. This is also called an annual wellness visit.  Regular dental and eye exams.  Immunizations.  Screening for certain conditions.  Healthy lifestyle choices, such as: ? Eating a healthy diet. ? Getting regular exercise. ? Not using drugs or products that contain nicotine and tobacco. ? Limiting alcohol use. What can I expect for my preventive care visit? Physical exam Your health care provider will check your:  Height and weight. These may be used to calculate your BMI (body mass index). BMI is a measurement that tells if you are at a healthy weight.  Heart rate and  blood pressure.  Body temperature.  Skin for abnormal spots. Counseling Your health care provider may ask you questions about your:  Past medical problems.  Family's medical history.  Alcohol, tobacco, and drug use.  Emotional well-being.  Home life and relationship well-being.  Sexual activity.  Diet, exercise, and sleep habits.  History of falls.  Memory and ability to understand (cognition).  Work and work Statistician.  Access to firearms. What immunizations do I need? Vaccines are usually given at various ages, according to a schedule. Your health care provider will recommend vaccines for you based on your age, medical history, and lifestyle or other factors, such as travel or where you work.   What tests do I need? Blood tests  Lipid and cholesterol levels. These may be checked every 5 years, or more often depending on your overall health.  Hepatitis C test.  Hepatitis B test. Screening  Lung cancer screening. You may have this screening every year starting at age 66 if you have a 30-pack-year history of smoking and currently smoke or have quit within the past 15 years.  Colorectal cancer screening. ? All adults should have this screening starting at age 27 and continuing until age 63. ? Your health care provider may recommend screening at age 69 if you are at increased risk. ? You will have tests every 1-10 years, depending on your results and the type of  screening test.  Prostate cancer screening. Recommendations will vary depending on your family history and other risks.  Genital exam to check for testicular cancer or hernias.  Diabetes screening. ? This is done by checking your blood sugar (glucose) after you have not eaten for a while (fasting). ? You may have this done every 1-3 years.  Abdominal aortic aneurysm (AAA) screening. You may need this if you are a current or former smoker.  STD (sexually transmitted disease) testing, if you are at  risk. Follow these instructions at home: Eating and drinking  Eat a diet that includes fresh fruits and vegetables, whole grains, lean protein, and low-fat dairy products. Limit your intake of foods with high amounts of sugar, saturated fats, and salt.  Take vitamin and mineral supplements as recommended by your health care provider.  Do not drink alcohol if your health care provider tells you not to drink.  If you drink alcohol: ? Limit how much you have to 0-2 drinks a day. ? Be aware of how much alcohol is in your drink. In the [place] one drink equals one 12 oz bottle of beer (355 mL), one 5 oz glass of wine (148 mL), or one 1 oz glass of hard liquor (44 mL).   Lifestyle  Take daily care of your teeth and gums. Brush your teeth every morning and night with fluoride toothpaste. Floss one time each day.  Stay active. Exercise for at least 30 minutes 5 or more days each week.  Do not use any products that contain nicotine or tobacco, such as cigarettes, e-cigarettes, and chewing tobacco. If you need help quitting, ask your health care provider.  Do not use drugs.  If you are sexually active, practice safe sex. Use a condom or other form of protection to prevent STIs (sexually transmitted infections).  Talk with your health care provider about taking a low-dose aspirin or statin.  Find healthy ways to cope with stress, such as: ? Meditation, yoga, or listening to music. ? Journaling. ? Talking to a trusted person. ? Spending time with friends and family. Safety  Always wear your seat belt while driving or riding in a vehicle.  Do not drive: ? If you have been drinking alcohol. Do not ride with someone who has been drinking. ? When you are tired or distracted. ? While texting.  Wear a helmet and other protective equipment during sports activities.  If you have firearms in your house, make sure you follow all gun safety procedures. What's next?  Visit your health care  provider once a year for an annual wellness visit.  Ask your health care provider how often you should have your eyes and teeth checked.  Stay up to date on all vaccines. This information is not intended to replace advice given to you by your health care provider. Make sure you discuss any questions you have with your health care provider. Document Revised: 08/10/2019 Document Reviewed: 11/05/2018 Elsevier Patient Education  2021 [employer].

## 2021-02-09 NOTE — Progress Notes (Signed)
Virtual Visit via Video Note  I connected with [name] on 02/09/21 at 4:08 PM by a video enabled telemedicine application and verified that I am speaking with the correct person using two identifiers.  Patient location:home My location: home   I discussed the limitations, risks, security and privacy concerns of performing an evaluation and management service by telephone and the availability of in person appointments. I also discussed with the patient that there may be a patient responsible charge related to this service. The patient expressed understanding and agreed to proceed, consent obtained  Chief complaint:  Chief Complaint  Patient presents with  . Annual Exam    Pt states he has no concerns today.  . Medication Refill    Pt would like refill of Crestor and bisoprolol for 90 days.     History of Present Illness: Bruce Williams is a 70 y.o. male  Annual wellness exam and medication refills as above  Care team Cardiology Dr. Ellyn Hack Primary care provider- me Neuro/sleep- [place] (for OSA - on CPAP -nightly use).    Had trace blood in urine noted at last 2 DOT exams. No gross hematuria. No urinary issues.   Cardiac: Cardiologist Dr. Ellyn Hack.  History of CAD.Stress testing 08/31/2020, mildly reduced EF at 45 to 54%, stress EF 47%.  With large size fixed defect in the inferior lateral wall consistent with known heart attack.  Read as intermediate risk but no evidence of ischemia.  As negative for ischemia okay for DOT physical. Bisoprolol 5 mg daily for hx of irregular HR Home readings:  BP Readings from Last 3 Encounters:  08/01/20 134/80  07/05/20 120/76  01/03/20 138/83   Lab Results  Component Value Date   CREATININE 0.93 06/28/2020   Hyperlipidemia: With CAD as above, treated with Crestor 20 mg daily, goal LDL less than 70. No new side effects/myalgias.  Lab Results  Component Value Date   CHOL 129 06/28/2020   HDL 59 06/28/2020   LDLCALC 59 06/28/2020    TRIG 46 06/28/2020   CHOLHDL 2.2 06/28/2020   Lab Results  Component Value Date   ALT 16 06/28/2020   AST 17 06/28/2020   ALKPHOS 49 06/28/2020   BILITOT 0.3 06/28/2020    Fall Risk  02/09/2021 07/05/2020 01/03/2020 11/02/2019 08/31/2018  Falls in the past year? 0 0 0 0 No  Number falls in past yr: 0 0 0 0 -  Injury with Fall? 0 0 0 0 -  Risk for fall due to : - - - History of fall(s) -  Follow up - Falls evaluation completed - Falls evaluation completed;Education provided -  adequate lighting in home.  No loose rugs.  No grab bars. Few stairs into home only.   cancer screening:  colonscopy - 04/2015- 10 years? Told ok on biopsy, but possible 5 year follow up. No results found for: PSA1, PSA The natural history of prostate cancer and ongoing controversy regarding screening and potential treatment outcomes of prostate cancer has been discussed with the patient. The meaning of a false positive PSA and a false negative PSA has been discussed. He indicates understanding of the limitations of this screening test and wishes NOT to proceed with screening PSA testing at this time.       Immunization History  Administered Date(s) Administered  . Influenza,inj,Quad PF,6+ Mos 09/26/2016  . Influenza-Unspecified 09/13/2015  pneumonia - declines.  Shingles - considering - will check with pharmacy.     Depression screen [facility] 2/9 02/09/2021 07/05/2020  01/03/2020 11/02/2019 08/31/2018  Decreased Interest 0 0 0 0 0  Down, Depressed, Hopeless 0 0 0 0 0  PHQ - 2 Score 0 0 0 0 0   Functional Status Survey: Is the patient deaf or have difficulty hearing?: No Does the patient have difficulty seeing, even when wearing glasses/contacts?: Yes (some visual changes - has optho visit planned.) Does the patient have difficulty concentrating, remembering, or making decisions?: No Does the patient have difficulty walking or climbing stairs?: No Does the patient have difficulty dressing or bathing?: No Does  the patient have difficulty doing errands alone such as visiting a doctor's office or shopping?: No  6CIT Screen 02/09/2021 01/03/2020 11/02/2019 08/31/2018  What Year? 0 points 0 points 0 points 0 points  What month? 0 points 0 points 0 points 0 points  What time? 0 points 0 points 0 points 0 points  Count back from 20 0 points 0 points 0 points 0 points  Months in reverse 0 points 0 points 0 points 0 points  Repeat phrase 0 points 0 points 4 points 0 points  Total Score 0 0 4 0    [place] Visit from 01/03/2020 in [facility]  AUDIT-C Score 0     optho - plan for eval soon as above.   Dental: recent visit.   Exercise: Climbing in and out truck.  Down to 330# at home.  Wt Readings from Last 3 Encounters:  08/30/20 (!) 347 lb (157.4 kg)  08/01/20 (!) 347 lb 6.4 oz (157.6 kg)  07/05/20 (!) 348 lb (157.9 kg)   Advanced directive: requests  Patient Active Problem List   Diagnosis Date Noted  . Abnormal stress electrocardiogram test using treadmill 08/07/2020  . Essential hypertension 08/31/2018  . Irregular heart beats 08/31/2018  . Encounter for physical examination related to employment 08/31/2018  . Coronary artery disease involving native heart without angina pectoris   . Hyperlipidemia with target LDL less than 70    Past Medical History:  Diagnosis Date  . Coronary artery disease involving native heart without angina pectoris 1998   No angina since MI in [place] Echo 08/2017  . History [place] [address]   ([place]) - BMS PCI RCA (inferoposterior Akinesis on Echo).  . Hyperlipidemia with target low density lipoprotein (LDL) cholesterol less than 70 mg/dL   . Myocardial infarction ([place])    Phreesia 02/06/2021  . OSA on CPAP   . Sleep apnea    [employer]reesia 02/06/2021   Past Surgical History:  Procedure Laterality Date  . ANKLE FRACTURE SURGERY Right 1980's  . TREADMILL STRESS ECHO  09/17/2017   Pre-stress  normal LV function at rest but no inferior posterior akinesis..  Exercised for: 35 min.  7.4 METS --> fair functional capacity.  Inferior posterior wall akinesis at rest consistent with prior MI --> maintained inferior posterior akinesis with exertion.  No evidence of ischemia.   No Known Allergies Prior to Admission medications   Medication Sig Start Date End Date Taking? Authorizing Provider  Ascorbic Acid (VITAMIN C) 1000 MG tablet Take 1,000 mg by mouth daily.   Yes [provider]  aspirin EC 81 MG tablet Take 81 mg by mouth daily.   Yes [provider]  bisoprolol (ZEBETA) 5 MG tablet Take 1 tablet (5 mg total) by mouth daily. 07/05/20  Yes [name], MD  BLACK CURRANT SEED OIL PO Take 1,250 mg by mouth.   Yes  [provider]  co-enzyme Q-10 30 MG capsule Take 30 mg by mouth 3 (three) times daily.   Yes [provider]  Multiple Vitamin (MULTI-VITAMINS) TABS Take by mouth.   Yes [provider]  Multiple Vitamins-Minerals (A THRU Z ULTIMATE MENS PO) Take by mouth.   Yes [provider]  Probiotic Product (PROBIOTIC MATURE ADULT PO) Take 94 mg by mouth.   Yes [provider]  rosuvastatin (CRESTOR) 20 MG tablet Take 1 tablet (20 mg total) by mouth daily. 07/05/20  Yes [name], MD  triamcinolone cream (KENALOG) 0.1 % Apply to patches of dry skin 2 times daily as needed 07/21/17  Yes [provider]  TURMERIC PO Take by mouth.   Yes [provider]  Vitamin D, Ergocalciferol, (DRISDOL) 1.25 MG (50000 UNIT) CAPS capsule Take 50,000 Units by mouth every 7 (seven) days.   Yes [provider]   Social History   Socioeconomic History  . Marital status: Married    Spouse name: Not on file  . Number of children: 2  . Years of education: 76  . Highest education level: Some college, no degree  Occupational History  . 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 Smoking status: Former Smoker    Types: Cigarettes    Quit date: 07/21/2009    Years since quitting: 11.5  . Smokeless tobacco: Never Used  . Tobacco comment: currenty vapes (tabaco)  Substance and Sexual Activity  . Alcohol use: Not Currently  . Drug use: Never  . Sexual activity: Not on file  Other Topics Concern  . Not on file  Social History Narrative   Married father 2 with 5 grandchildren and 2 great-grandchildren.  He lives with his current wife of 9 years.      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  [address] a [place] radius based out of [employer]   Social Determinants of Health   Financial Resource Strain: Not on file  Food Insecurity: Not on file  Transportation Needs: Not on file  Physical Activity: Not on file  Stress: Not on file  Social Connections: Not on file  Intimate Partner Violence: Not on file    Observations/Objective: There were no vitals filed for this visit.   Assessment and Plan: Encounter for Medicare annual wellness exam  - - anticipatory guidance as below in AVS, screening labs if needed. Health maintenance items as above in HPI discussed/recommended as applicable.  - no concerning responses on depression, fall, or functional status screening. Any positive responses noted as above. Advanced directives discussed as in CHL.   -Home blood pressure monitoring with RTC precautions  -We will try to get colonoscopy report as may need 5-year follow-up, would be due.  -Follow-up with ophthalmology as planned for vision difficulties.  -Advanced directive discussed, will mail paperwork.  -Noted to have possible trace hematuria on his DOT physicals, in office testing/evaluation recommended within the next 6 weeks.  Asymptomatic at this time.  Hyperlipidemia, unspecified hyperlipidemia type - Plan: rosuvastatin (CRESTOR) 20 MG tablet, Comprehensive metabolic panel, Lipid panel  -Tolerating Crestor, commended on weight loss.  Low intensity exercise  discussed, watch diet.  Coronary artery disease involving native heart without angina pectoris, unspecified vessel or lesion type - Plan: rosuvastatin (CRESTOR) 20 MG tablet, bisoprolol (ZEBETA) 5 MG tablet  -Tolerating current meds, continue routine follow-up with cardiology, ongoing monitoring with DOT physical requirements.  OSA on CPAP  -  Tolerating CPAP.  Follow Up Instructions:  Patient Instructions   Keep a record of your blood pressures outside of the office and if running over 140/90 let me know.  No med changes at this time.   We will try to get a copy of your last colonoscopy, but if you find out the name of your previous gastroenterologist let us know so we can request that record.  Follow-up with me in my new practice within the next 6 weeks if possible so we can discuss the blood in the urine.  If you notice any blood in the urine or new urinary symptoms during that time be seen right away.  You can also see my colleague sooner if needed at that office to evaluate the blood in the urine further.  Let me know if there are questions.   Check with your pharmacy about the shingles vaccine.   We will send you information about the advanced directives  Follow-up with the eye care provider as planned.   Return to the clinic or go to the nearest emergency room if any of your symptoms worsen or new symptoms occur.   Here are a few lab drawing stations for you to have your labwork performed:  [place], [place], [place] [id number]  [facility] [address] [place], [place] [address]    Preventive Care 65 Years and Older, Male Preventive care refers to lifestyle choices and visits with your health care provider that can promote health and wellness. This includes:  A yearly physical exam. This is also called an annual wellness visit.  Regular dental and eye exams.  Immunizations.  Screening for certain conditions.  Healthy lifestyle choices, such  as: ? Eating a healthy diet. ? Getting regular exercise. ? Not using drugs or products that contain nicotine and tobacco. ? Limiting alcohol use. What can I expect for my preventive care visit? Physical exam Your health care provider will check your:  Height and weight. These may be used to calculate your BMI (body mass index). BMI is a measurement that tells if you are at a healthy weight.  Heart rate and blood pressure.  Body temperature.  Skin for abnormal spots. Counseling Your health care provider may ask you questions about your:  Past medical problems.  Family's medical history.  Alcohol, tobacco, and drug use.  Emotional well-being.  Home life and relationship well-being.  Sexual activity.  Diet, exercise, and sleep habits.  History of falls.  Memory and ability to understand (cognition).  Work and work Statistician.  Access to firearms. What immunizations do I need? Vaccines are usually given at various ages, according to a schedule. Your health care provider will recommend vaccines for you based on your age, medical history, and lifestyle or other factors, such as travel or where you work.   What tests do I need? Blood tests  Lipid and cholesterol levels. These may be checked every 5 years, or more often depending on your overall health.  Hepatitis C test.  Hepatitis B test. Screening  Lung cancer screening. You may have this screening every year starting at age 50 if you have a 30-pack-year history of smoking and currently smoke or have quit within the past 15 years.  Colorectal cancer screening. ? All adults should have this screening starting at age 25 and continuing until age 33. ? Your health care provider may recommend screening at age 33 if you are at increased risk. ? You will have tests  every 1-10 years, depending on your results and the type of screening test.  Prostate cancer screening. Recommendations will vary depending on your family  history and other risks.  Genital exam to check for testicular cancer or hernias.  Diabetes screening. ? This is done by checking your blood sugar (glucose) after you have not eaten for a while (fasting). ? You may have this done every 1-3 years.  Abdominal aortic aneurysm (AAA) screening. You may need this if you are a current or former smoker.  STD (sexually transmitted disease) testing, if you are at risk. Follow these instructions at home: Eating and drinking  Eat a diet that includes fresh fruits and vegetables, whole grains, lean protein, and low-fat dairy products. Limit your intake of foods with high amounts of sugar, saturated fats, and salt.  Take vitamin and mineral supplements as recommended by your health care provider.  Do not drink alcohol if your health care provider tells you not to drink.  If you drink alcohol: ? Limit how much you have to 0-2 drinks a day. ? Be aware of how much alcohol is in your drink. In the [place] one drink equals one 12 oz bottle of beer (355 mL), one 5 oz glass of wine (148 mL), or one 1 oz glass of hard liquor (44 mL).   Lifestyle  Take daily care of your teeth and gums. Brush your teeth every morning and night with fluoride toothpaste. Floss one time each day.  Stay active. Exercise for at least 30 minutes 5 or more days each week.  Do not use any products that contain nicotine or tobacco, such as cigarettes, e-cigarettes, and chewing tobacco. If you need help quitting, ask your health care provider.  Do not use drugs.  If you are sexually active, practice safe sex. Use a condom or other form of protection to prevent STIs (sexually transmitted infections).  Talk with your health care provider about taking a low-dose aspirin or statin.  Find healthy ways to cope with stress, such as: ? Meditation, yoga, or listening to music. ? Journaling. ? Talking to a trusted person. ? Spending time with friends and family. Safety  Always wear  your seat belt while driving or riding in a vehicle.  Do not drive: ? If you have been drinking alcohol. Do not ride with someone who has been drinking. ? When you are tired or distracted. ? While texting.  Wear a helmet and other protective equipment during sports activities.  If you have firearms in your house, make sure you follow all gun safety procedures. What's next?  Visit your health care provider once a year for an annual wellness visit.  Ask your health care provider how often you should have your eyes and teeth checked.  Stay up to date on all vaccines. This information is not intended to replace advice given to you by your health care provider. Make sure you discuss any questions you have with your health care provider. Document Revised: 08/10/2019 Document Reviewed: 11/05/2018 Elsevier Patient Education  2021 [place].      I discussed the assessment and treatment plan with the patient. The patient was provided an opportunity to ask questions and all were answered. The patient agreed with the plan and demonstrated an understanding of the instructions.   The patient was advised to call back or seek an in-person evaluation if the symptoms worsen or if the condition fails to improve as anticipated.  I provided 40 minutes of non-face-to-face time  during this encounter.   [name], MD

## 2021-02-12 ENCOUNTER — Encounter: Payer: Self-pay | Admitting: Family Medicine

## 2021-03-08 ENCOUNTER — Ambulatory Visit (INDEPENDENT_AMBULATORY_CARE_PROVIDER_SITE_OTHER): Payer: Medicare Other | Admitting: Neurology

## 2021-03-08 ENCOUNTER — Other Ambulatory Visit: Payer: Self-pay | Admitting: Family Medicine

## 2021-03-08 ENCOUNTER — Telehealth: Payer: Self-pay

## 2021-03-08 ENCOUNTER — Encounter: Payer: Self-pay | Admitting: Neurology

## 2021-03-08 VITALS — BP 119/70 | HR 74 | Ht 71.0 in | Wt 327.0 lb

## 2021-03-08 DIAGNOSIS — G4733 Obstructive sleep apnea (adult) (pediatric): Secondary | ICD-10-CM | POA: Insufficient documentation

## 2021-03-08 DIAGNOSIS — I499 Cardiac arrhythmia, unspecified: Secondary | ICD-10-CM | POA: Diagnosis not present

## 2021-03-08 DIAGNOSIS — I2510 Atherosclerotic heart disease of native coronary artery without angina pectoris: Secondary | ICD-10-CM

## 2021-03-08 DIAGNOSIS — E785 Hyperlipidemia, unspecified: Secondary | ICD-10-CM | POA: Diagnosis not present

## 2021-03-08 DIAGNOSIS — Z0289 Encounter for other administrative examinations: Secondary | ICD-10-CM

## 2021-03-08 DIAGNOSIS — Z9989 Dependence on other enabling machines and devices: Secondary | ICD-10-CM

## 2021-03-08 NOTE — Patient Instructions (Signed)
  Unfortunately, we suffer in the CPAP/ BiPAP world from supply chain delays and shortages just as so many other branches of our economy.  On top of all , there a has been a major recall of a leading brand name for PAP products and respirator machines.  CPAPs we had ordered in September 21 have just been announced for delivery in January- February.   A new , Chinese made brand called " LUNA-CPAP" can be ordered with much less wait  and has been offered to [place], but we had hesitated as there were no reliability scores and no patient experiences known regarding this product.

## 2021-03-08 NOTE — Progress Notes (Signed)
SLEEP MEDICINE CLINIC   Provider:  Larey Seat, M.D.   Primary Care Physician:  [name], MD   Referring Provider: [name], MD    Chief Complaint  Patient presents with  . Follow-up    Pt alone, rm 10. Presents today for a follow up visit for supplies for machine. Cpap overall is working well, he has no issues. DME [facility]. Current machinewas set up 04/13/15.pt needs updated script for supplies    HPI:  Bruce Williams is a 70 y.o. male patient, seen here in a referral from Dr. Carlota Raspberry for a sleep apnea evaluation-  I have the pleasure of meeting Bruce Williams last time in September 2019 at this time he already was a CPAP user and old the current machine since 2016.  He has been 100% compliant CPAP user 6 hours 41 minutes on average his minimum pressure setting is 5 maximum pressure setting 17 cmH2O with a 3 cm expiratory pressure relief.  His 95th percentile pressure is 14.3 cm so he is well within his current settings.  The residual AHI is 2.2 and there are no Cheyne-Stokes respirations measured.  A CPAP compliance report is 100% compliant CPAP compliance reports do not show the current baseline of apnea.  Hypoxemia.  His BMI has been 45 and has been in that range for a long time.  So I like for him to write a new prescription for his supplies, I also would order at least a home sleep test to have a new baseline and based on his current apnea count I would set the new machine probably similar to the old one.  The patient is okay with this approach.     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 patient , with known OSA and CPAP treatment.  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 He was first diagnosed in 2002 at [facility] [place] in hospital sleep lab.  He learned that he had sleep apnea and was treated with CPAP ever since.  Once he moved  to [place] he continued treatment here at [facility].  He has been using an AutoSet air sense 10 machine, and this was just issued to him in May 2016.  It is set between 5 and 15 cmH2O with 3 cm EPR, he is using on average 6 hours and 35 minutes of CPAP therapy nightly, 100% compliance by download and his residual AHI is 1.8.  He does however have high air leakage, the 95th percentile pressure is close to 14 cm which would mean that he could use a little bit higher pressure settings on his AutoSet.  He feels clinically that his CPAP therapy is sufficient he has not endorsed elevated sleepiness levels on the Epworth sleepiness score.  Today he endorses at 4 points, fatigue severity at 23 points, and the geriatric depression score was endorsed at 1 out of 15 points.  DOT related 90-day download shows equally 100% of use at time 99% by time over 4 hours, only one night to use the machine for 3-1/2 hours.  Average use of time 6 hours 26 minutes, same settings as above, residual AHI 2.0 with obstructive apneas being 0.5.  Similar air leak.  Chief complaint according to patient :He needs to transfer care and wanted to be in the [facility]  Bruce Williams has been a compliant CPAP  user for well over 15 years, and he has been followed at [facility] in [facility] for the last 3 or 5 years.  That needs to be no change in settings, his current machine is 64.68 years old but I like for him to have a new nasal pillow fitted.  The ResMed air fit P 10 is very easily sliding off and he may do better with an N 30 I. I will follow him yearly until he needs a new machine.    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  He works about 10 to 12 hours daily in his retirement job.  He just transitioned to Medicare.  Occasional he will spend the evening at church functions, the couple goes bowling, movies goes to the movies  but he is not necessarily physically active. His bedtime is usually between 10 and 11 PM unless he has a very early morning assignment.  He usually has no trouble to initiate sleep, on occasion he will be early in bed because of the early morning assignments, but he usually sleeps well in a cool, quiet and dark bedroom.  He does not have nocturia.  He sleeps on either side, mostly mostly the left. He sleeps on only one pillow for head support in a nonadjustable bed.  He rises in the morning usually at 6 AM or 7 AM, and he usually feels rested at the time.  He averages 7 to 8 hours of nocturnal sleep, most nights uninterrupted.  I have the pleasure of reviewing Mr. Bruce Williams's last sleep study which was a home sleep test performed at [facility] on [address] in [place] on Mar 30, 2015 by Dr. at bedtime Mohammed.  The AHI was 57.6, supine sleep position accentuated the AHI to 59.3, the majority was obstructive 28.5/h followed by centrals at 14.4/h.  Oxygen saturation low was 79%, duration of 31 minutes at or below 88%.  1 hour 49 minutes.  Pulse rate between 47 and 94 bpm apparently no arrhythmia was captured.  He was issued the auto set air sense 10 machine after this test.  I also have a pulmonology note available obstructive sleep apnea ordering AutoPap 5 through 15 cmH2O, risk factor BMI 45-49.9 in adult.  Based on these notes I should be able to transfer his CPAP care and supplies to a local DME.   Sleep medical history and family sleep history: CAD, MI in 1998 and stent [place] in [place]. - reports ongoing  irregular heart beats.  OSA affected his father, another air force veteran. No sleep walking, enuresis , no night terrors.   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 The patient is married, has 3 adult children ( oldest 40 year old daughter with 3 children, 2 step children) , 5 grandchildren- former 24 year smoker- quit 5 years ago in 2014.  Beer / wine - one glass a week. Caffeine ; 2 cups of coffee a day- no sodas, not often iced tea.     Review of Systems: Out of a complete 14 system review, the patient complains of only the following symptoms, and all other reviewed systems are negative.   Snoring, poor sleep if not on CPAP. Weight gain.  Epworth score: 3/ 24 points , Fatigue severity score: 23/ 63 points  , depression score 1/ 15   How likely are you to  doze in the following situations: 0 = not likely, 1 = slight chance, 2 = moderate chance, 3 = high chance  Sitting and Reading? Watching Television? Sitting inactive in a public place (theater or meeting)? Lying down in the afternoon when circumstances permit? Sitting and talking to someone? Sitting quietly after lunch without alcohol? In a car, while stopped for a few minutes in traffic? As a passenger in a car for an hour without a break?  Total = 3  DOT wants a CPAP report once every 2 -year.    Social History   Socioeconomic History  . Marital status: Married    Spouse name: Not on file  . Number of children: 2  . Years of education: 15  . Highest education level: Some college, no degree  Occupational History  . 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 Smoking status: Former Smoker    Types: Cigarettes    Quit date: 07/21/2009    Years since quitting: 11.6  . Smokeless tobacco: Never Used  . Tobacco comment: currenty vapes (tabaco)  Substance and Sexual Activity  . Alcohol use: Not Currently  . Drug use: Never  . Sexual activity: Not on file  Other Topics Concern  . Not on file  Social History Narrative   Married father 2 with 5 grandchildren and 2 great-grandchildren.  He lives with his current wife of 9 years.      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 CAD Mother   . Diabetes Mother   . CAD Father   . Dementia Father     Past Medical History:  Diagnosis Date  . Coronary artery disease involving native heart without angina pectoris 1998   No angina since MI in [place] 08/2017  . History of acute Inferior-[place]osterior wall [place] [address]   ([place]) - BMS PCI RCA (inferoposterior Akinesis on Echo).  . Hyperlipidemia with target low density lipoprotein (LDL) cholesterol less than 70 mg/dL   . Myocardial infarction ([place])    Phreesia 02/06/2021  . OSA on CPAP   . Sleep apnea    Phreesia 02/06/2021    Past Surgical History:  Procedure Laterality Date  . ANKLE FRACTURE SURGERY Right 1980's  . TREADMILL STRESS ECHO  09/17/2017   Pre-stress normal LV function at rest but no inferior posterior akinesis..  Exercised for: 35 min.  7.4 METS --> fair functional capacity.  Inferior posterior wall akinesis at rest consistent with prior MI --> maintained inferior posterior akinesis with exertion.  No evidence of ischemia.    Current Outpatient Medications  Medication Sig Dispense Refill  . Ascorbic Acid (VITAMIN C) 1000 MG tablet Take 1,000 mg by mouth daily.    Marland Kitchen aspirin EC 81 MG tablet Take 81 mg by mouth daily.    . bisoprolol (ZEBETA) 5 MG tablet Take 1 tablet (5 mg total) by mouth daily. 90 tablet 2  . BLACK CURRANT SEED OIL PO Take 1,250 mg by mouth.    . co-enzyme Q-10 30  MG capsule Take 30 mg by mouth 3 (three) times daily.    . Multiple Vitamin (MULTI-VITAMINS) TABS Take by mouth.    . Multiple Vitamins-Minerals (A THRU Z ULTIMATE MENS PO) Take by mouth.    . Probiotic Product (PROBIOTIC MATURE ADULT PO) Take 94 mg by mouth.    . rosuvastatin (CRESTOR) 20 MG tablet Take 1 tablet (20 mg total) by  mouth daily. 90 tablet 2  . triamcinolone cream (KENALOG) 0.1 % Apply to patches of dry skin 2 times daily as needed    . TURMERIC PO Take by mouth.    . Vitamin D, Ergocalciferol, (DRISDOL) 1.25 MG (50000 UNIT) CAPS capsule Take 50,000 Units by mouth every 7 (seven) days.     No current facility-administered medications for this visit.    Allergies as of 03/08/2021  . (No Known Allergies)    Vitals: BP 119/70   Pulse 74   Ht 5\' 11"  (1.803 m)   Wt (!) 327 lb (148.3 kg)   BMI 45.61 kg/m  Last Weight:  Wt Readings from Last 1 Encounters:  03/08/21 (!) 327 lb (148.3 kg)   MKL:KJZP mass index is 45.61 kg/m.     Last Height:   Ht Readings from Last 1 Encounters:  03/08/21 5\' 11"  (1.803 m)    Physical exam:  General: The patient is awake, alert and appears not in acute distress. The patient is well groomed. Dentures full -lower Head: Normocephalic, atraumatic. Neck is supple. Mallampati 3-  very elongated uvula.   neck circumference:20.5 . Nasal airflow patent . Retrognathia is not seen.  Cardiovascular:  Regular rate and rhythm , without  murmurs or carotid bruit, and without distended neck veins. Respiratory: Lungs are clear to auscultation. Skin:  With evidence of right ankle edema. Hardware in place.  Trunk: BMI is 45.61.  The patient's posture is erect.   Neurologic exam : The patient is awake and alert, oriented to place and time.   Attention span & concentration ability appears normal.  Speech is fluent,  without  dysarthria, dysphonia or aphasia.  Mood and affect are appropriate.  Cranial nerves: Intact taste and smell- Pupils are equal and briskly reactive to light.  Funduscopic exam without evidence of pallor or edema, early cataracts forming- still no surgery .  Extraocular movements  in vertical and horizontal planes intact and without nystagmus.  Visual fields by finger perimetry are intact. Hearing to finger rub reduced. Facial sensation intact to fine touch.   Facial motor strength is symmetric and his tongue and uvula move midline.  Shoulder shrug was symmetrical. He reports right shoulder pain.  Motor exam:  Normal tone, muscle bulk and symmetric strength in all extremities. Full grip strength  Sensory:  Fine touch, pinprick and vibration were tested in all extremities. Proprioception tested in the upper extremities was normal. Coordination:  Finger-to-nose maneuver  normal without evidence of ataxia, dysmetria or tremor. Gait and station: Patient walks without assistive device and is able unassisted to climb up to the exam table. Strength within normal limits. Stance is stable and normal. Deep tendon reflexes: in the upper and lower extremities are  intact. He has a swollen fractured ankle and no achilles tendone reflex.    Assessment:  After physical and neurologic examination, review of laboratory studies,  Personal review of imaging studies, reports of other /same  Imaging studies, results of polysomnography and / or neurophysiology testing and pre-existing records as far as provided in visit., my assessment is  1) he had transferred  CPAP care in 2019  to [facility] and to local DME- We will follow  Him , I ordered a HST for a new CPAP baseline and will order new supplies.  He is using nasal Resmed Air fit P10- pillows in large size, slips a lot , creating air leaks- he wants to stay with nasal pillows.      The patient was advised of the nature of the diagnosed disorder , the treatment options and the  risks for general health and wellness arising from not treating the condition.   I spent more than 20 minutes of face to face time with the patient.  Greater than 50% of time was spent in counseling and coordination of care. We have discussed the diagnosis and differential and I answered the patient's questions.    Plan:  Treatment plan and additional workup :  HST and new supplies, RV in 3-4 month on new CPAP.  RV yearly with Np or me.       Larey Seat, MD 03/08/2439, 1:02 AM  Certified in Neurology by ABPN Certified in [place] by [facility] [address], [place], [address] [address]

## 2021-03-08 NOTE — Telephone Encounter (Signed)
[facility] called with request for [place] to fax orders for the Lipid and CMP  Faxed to [phone]

## 2021-03-09 LAB — COMPREHENSIVE METABOLIC PANEL
ALT: 15 IU/L (ref 0–44)
AST: 18 IU/L (ref 0–40)
Albumin/Globulin Ratio: 1.5 (ref 1.2–2.2)
Albumin: 4.2 g/dL (ref 3.8–4.8)
Alkaline Phosphatase: 44 IU/L (ref 44–121)
BUN/Creatinine Ratio: 19 (ref 10–24)
BUN: 19 mg/dL (ref 8–27)
Bilirubin Total: 0.4 mg/dL (ref 0.0–1.2)
CO2: 21 mmol/L (ref 20–29)
Calcium: 9.2 mg/dL (ref 8.6–10.2)
Chloride: 104 mmol/L (ref 96–106)
Creatinine, Ser: 1 mg/dL (ref 0.76–1.27)
Globulin, Total: 2.8 g/dL (ref 1.5–4.5)
Glucose: 91 mg/dL (ref 65–99)
Potassium: 4.6 mmol/L (ref 3.5–5.2)
Sodium: 140 mmol/L (ref 134–144)
Total Protein: 7 g/dL (ref 6.0–8.5)
eGFR: 82 mL/min/{1.73_m2} (ref >59)

## 2021-03-09 LAB — LIPID PANEL
Chol/HDL Ratio: 2.4 ratio (ref 0.0–5.0)
Cholesterol, Total: 124 mg/dL (ref 100–199)
HDL: 51 mg/dL (ref >39)
LDL Chol Calc (NIH): 60 mg/dL (ref 0–99)
Triglycerides: 59 mg/dL (ref 0–149)
VLDL Cholesterol Cal: 13 mg/dL (ref 5–40)

## 2021-03-23 ENCOUNTER — Ambulatory Visit (INDEPENDENT_AMBULATORY_CARE_PROVIDER_SITE_OTHER): Payer: Medicare Other | Admitting: Neurology

## 2021-03-23 ENCOUNTER — Other Ambulatory Visit: Payer: Self-pay

## 2021-03-23 DIAGNOSIS — I499 Cardiac arrhythmia, unspecified: Secondary | ICD-10-CM

## 2021-03-23 DIAGNOSIS — Z0289 Encounter for other administrative examinations: Secondary | ICD-10-CM

## 2021-03-23 DIAGNOSIS — Z9989 Dependence on other enabling machines and devices: Secondary | ICD-10-CM

## 2021-03-23 DIAGNOSIS — G4733 Obstructive sleep apnea (adult) (pediatric): Secondary | ICD-10-CM

## 2021-03-23 DIAGNOSIS — I2510 Atherosclerotic heart disease of native coronary artery without angina pectoris: Secondary | ICD-10-CM

## 2021-03-26 ENCOUNTER — Telehealth: Payer: Self-pay | Admitting: Neurology

## 2021-03-26 NOTE — Telephone Encounter (Signed)
Called the patient.  There was no answer.  Left a detailed message advising that the orders for supplies have been sent to the company from [place]. There shouldn't be anything else needed from [place]. Advised the pt to call back with any questions.

## 2021-03-26 NOTE — Telephone Encounter (Signed)
Order was sent for the patient at his last office visit,03/08/21, pt must follow with [facility] ([place] at this point in regards to getting supplies.

## 2021-03-26 NOTE — Telephone Encounter (Signed)
Pt states [facility] has already sent the request for his CPAP supplies.  Pt is calling to make the request for a call with status of the request being processed

## 2021-03-29 NOTE — Progress Notes (Signed)
[facility] TEST (Watch PAT)  STUDY DATA loaded: 03-29-21  DOB: Nov 29, 1950  MRN: [id number]  ORDERING CLINICIAN: Larey Seat, MD   REFERRING CLINICIAN: [name], MD   CLINICAL INFORMATION/HISTORY: When Mr. Sculley was seen here last time, in September 2019, he already was a CPAP user and used the current machine since 2016.  He has been a 100% compliant CPAP user, averaging  6 hours 41 minutes ,  the minimum pressure setting is 5 cm water,  maximum pressure setting 17 cmH2O ,with a 3 cm expiratory pressure relief.  His 95th percentile pressure is 14.3 cm- so he is well within his current settings.  The residual AHI is 2.2 and there are no Cheyne-Stokes respirations measured.  A CPAP compliance report is 100% compliant CPAP compliance reports do not show the current baseline of apnea.  Hypoxemia.   His BMI has been 45 and has been in that range for a long time.  So I like for him to write a new prescription for his supplies, I also would order at least a home sleep test to have a new baseline and based on his current apnea count I would set the new machine probably similar to the old one.  The patient is okay with this approach.  Epworth sleepiness score: 3/24.  BMI: 45.7 kg/m  Neck Circumference: 20.5 "  FINDINGS:   Total Record Time (hours, min): 5 h 16 min  Total Sleep Time (hours, min):  4 h 47 min  Percent REM (%):    11.13 %   Calculated pAHI (per hour): 72.7       REM pAHI: 59.0    NREM pAHI: 74.8 Supine AHI: 66.8   Oxygen Saturation (%) Mean: 91  Minimum oxygen saturation (%):        80   O2 Saturation Range (%): 80-98  O2Saturation (minutes) <=88%: 3.9  Pulse Mean (bpm):    59  Pulse Range (59-84)   IMPRESSION: This HST confirmed the presence of severe OSA (obstructive sleep apnea) , loud snoring and intermittent bradycardia. There was no hypoxemia noted.   RECOMMENDATION: The patient is due for a new CPAP machine and can continue  to use the mask/ interface of his choice. The new machine will also be set from 5 - 17 cm water pressure, 3 cm EPR and heated humidity.      INTERPRETING PHYSICIAN:  Larey Seat, MD    [facility] and [facility] Sleep Board certified by The [employer] of Sleep Medicine and Diplomate of the [employer] of Sleep Medicine. Board certified In Neurology through the [place], Fellow of the [employer] of Neurology. Medical Director of [employer].  Sleep Summary  Oxygen Saturation Statistics   Start Study Time: End Study Time: Total Recording Time:          12:34:09 AM 5:51:08 AM 5 h, 16 min  Total Sleep Time % REM of Sleep Time:  4 h, 47 min  11.1    Mean: 91 Minimum: 80 Maximum: 98  Mean of Desaturations Nadirs (%):   88  Oxygen Desatur. %:  4-9 10-20 >20 Total  Events Number Total   186  65 74.1 25.9  0 0.0  251 100.0  Oxygen Saturation: <90 <=88 <85 <80 <70  Duration (minutes): Sleep % 46.3 16.1 30.0 3.9 10.4 1.4 0.0 0.0 0.0 0.0     Respiratory Indices      Total Events REM NREM  All Night  pRDI: pAHI 3%: ODI 4%: pAHIc 3%: % CSR: pAHI 4%:  290  290  251  137 49.0 279 59.0 59.0 60.9 41.8 74.8 74.8 63.3 33.2 72.7 72.7 63.0 34.4 70.0       Pulse Rate Statistics during Sleep (BPM)      Mean: 59 Minimum: N/A Maximum: 84              Body Position Statistics  Position Supine Prone Right Left Non-Supine  Sleep (min) 105.5 28.0 0.5 153.5 182.0  Sleep % 36.7 9.7 0.2 53.4 63.3  pRDI 66.8 62.9 N/A 80.0 76.7  pAHI 3% 66.8 62.9 N/A 80.0 76.7  ODI 4% 61.8 62.9 N/A 64.0 63.7     Snoring Statistics Snoring Level (dB) >40 >50 >60 >70 >80 >Threshold (45)  Sleep (min) 140.5 22.0 5.0 0.0 0.0 35.7  Sleep % 48.9 7.7 1.7 0.0 0.0 12.4    Mean: 42 dB

## 2021-04-12 NOTE — Telephone Encounter (Signed)
Pt has called re: his CPAP supplies.  Pt was reminded of the last message from [facility] and she even offered the [place] office number, pt has declined.  Pt states he was told by DME that the order was not properly submitted to Medicare standards as to why he still doesn't have his supplies.  Please call pt.

## 2021-04-12 NOTE — Telephone Encounter (Signed)
The order was submitted through our messaging system on 03/08/21 at the date of the visit. The order he is referring to is where we have to sign off in go scripts and the company originally sent it to me wrong where I had to void the order. They have resent it and I have signed off on it. I have advised the DME company's management to reach out to the patient.   I have also called the patient and made him aware of the situation.

## 2021-04-16 ENCOUNTER — Encounter: Payer: Self-pay | Admitting: Neurology

## 2021-04-16 DIAGNOSIS — Z0289 Encounter for other administrative examinations: Secondary | ICD-10-CM | POA: Insufficient documentation

## 2021-04-16 NOTE — Progress Notes (Signed)
IMPRESSION: This HST confirmed the presence of severe OSA (obstructive sleep apnea) , loud snoring and intermittent bradycardia. There was no hypoxemia noted.   RECOMMENDATION: The patient is due for a new CPAP machine and can continue to use the mask/ interface of his choice. The new machine will also be set from 5 - 17 cm water pressure, 3 cm EPR and heated humidity.  Until a new machine can be obtained, we will ask for his DME to supply the needed filter, mask and tubing.      INTERPRETING PHYSICIAN:  Larey Seat, MD

## 2021-04-16 NOTE — Addendum Note (Signed)
Addended by: Larey Seat on: 04/16/2021 11:19 AM   Modules accepted: Orders

## 2021-04-16 NOTE — Procedures (Signed)
[facility] TEST (Watch PAT)  STUDY DATA loaded: 03-29-21  DOB: 12/09/1950  MRN: [id number]  ORDERING CLINICIAN: Larey Seat, MD   REFERRING CLINICIAN: [name], MD   CLINICAL INFORMATION/HISTORY: When Bruce Williams was seen here last time, in September 2019, he already was a CPAP user and used the current machine since 2016.  He has been a 100% compliant CPAP user, averaging  6 hours 41 minutes ,  the minimum pressure setting is 5 cm water,  maximum pressure setting 17 cmH2O ,with a 3 cm expiratory pressure relief.  His 95th percentile pressure is 14.3 cm- so he is well within his current settings.  The residual AHI is 2.2 and there are no Cheyne-Stokes respirations measured.  A CPAP compliance report is 100% compliant CPAP compliance reports do not show the current baseline of apnea.  Hypoxemia.   His BMI has been 45 and has been in that range for a long time.  So I like for him to write a new prescription for his supplies, I also would order at least a home sleep test to have a new baseline and based on his current apnea count I would set the new machine probably similar to the old one.  The patient is okay with this approach.  Epworth sleepiness score: 3/24.  BMI: 45.7 kg/m  Neck Circumference: 20.5 "  FINDINGS:   Total Record Time (hours, min): 5 h 16 min  Total Sleep Time (hours, min):  4 h 47 min  Percent REM (%):    11.13 %   Calculated pAHI (per hour): 72.7       REM pAHI: 59.0    NREM pAHI: 74.8 Supine AHI: 66.8   Oxygen Saturation (%) Mean: 91  Minimum oxygen saturation (%):        80   O2 Saturation Range (%): 80-98  O2Saturation (minutes) <=88%: 3.9  Pulse Mean (bpm):    59  Pulse Range (59-84)   IMPRESSION: This HST confirmed the presence of severe OSA (obstructive sleep apnea) , loud snoring and intermittent bradycardia. There was no hypoxemia noted.   RECOMMENDATION: The patient is due for a new CPAP machine and can continue to  use the mask/ interface of his choice. The new machine will also be set from 5 - 17 cm water pressure, 3 cm EPR and heated humidity.  Until a new machine can be obtained, we will ask for his DME to supply the needed filter, mask and tubing.      INTERPRETING PHYSICIAN:  Larey Seat, MD    [facility] and [facility] Sleep Board certified by The [employer] of Sleep Medicine and Diplomate of the [employer] of Sleep Medicine. Board certified In Neurology through the [place], Fellow of the [employer] of Neurology. Medical Director of [employer].  Sleep Summary  Oxygen Saturation Statistics   Start Study Time: End Study Time: Total Recording Time:          12:34:09 AM 5:51:08 AM 5 h, 16 min  Total Sleep Time % REM of Sleep Time:  4 h, 47 min  11.1    Mean: 91 Minimum: 80 Maximum: 98  Mean of Desaturations Nadirs (%):   88  Oxygen Desatur. %:  4-9 10-20 >20 Total  Events Number Total   186  65 74.1 25.9  0 0.0  251 100.0  Oxygen Saturation: <90 <=88 <85 <80 <70  Duration (minutes): Sleep % 46.3 16.1 30.0 3.9  10.4 1.4 0.0 0.0 0.0 0.0     Respiratory Indices      Total Events REM NREM All Night  pRDI: pAHI 3%: ODI 4%: pAHIc 3%: % CSR: pAHI 4%:  290  290  251  137 49.0 279 59.0 59.0 60.9 41.8 74.8 74.8 63.3 33.2 72.7 72.7 63.0 34.4 70.0       Pulse Rate Statistics during Sleep (BPM)      Mean: 59 Minimum: N/A Maximum: 84              Body Position Statistics  Position Supine Prone Right Left Non-Supine  Sleep (min) 105.5 28.0 0.5 153.5 182.0  Sleep % 36.7 9.7 0.2 53.4 63.3  pRDI 66.8 62.9 N/A 80.0 76.7  pAHI 3% 66.8 62.9 N/A 80.0 76.7  ODI 4% 61.8 62.9 N/A 64.0 63.7     Snoring Statistics Snoring Level (dB) >40 >50 >60 >70 >80 >Threshold (45)  Sleep (min) 140.5 22.0 5.0 0.0 0.0 35.7  Sleep % 48.9 7.7 1.7 0.0 0.0 12.4

## 2021-07-12 ENCOUNTER — Ambulatory Visit: Payer: Medicare Other | Admitting: Family Medicine

## 2021-07-12 ENCOUNTER — Encounter: Payer: Self-pay | Admitting: *Deleted

## 2021-07-20 ENCOUNTER — Encounter: Payer: Self-pay | Admitting: Family Medicine

## 2021-07-20 ENCOUNTER — Ambulatory Visit (INDEPENDENT_AMBULATORY_CARE_PROVIDER_SITE_OTHER): Payer: Medicare Other | Admitting: Family Medicine

## 2021-07-20 ENCOUNTER — Other Ambulatory Visit: Payer: Self-pay

## 2021-07-20 VITALS — BP 138/76 | HR 66 | Temp 98.2°F | Resp 17 | Ht 71.0 in | Wt 332.6 lb

## 2021-07-20 DIAGNOSIS — R8281 Pyuria: Secondary | ICD-10-CM

## 2021-07-20 DIAGNOSIS — R809 Proteinuria, unspecified: Secondary | ICD-10-CM

## 2021-07-20 DIAGNOSIS — B360 Pityriasis versicolor: Secondary | ICD-10-CM

## 2021-07-20 DIAGNOSIS — D369 Benign neoplasm, unspecified site: Secondary | ICD-10-CM

## 2021-07-20 DIAGNOSIS — I2510 Atherosclerotic heart disease of native coronary artery without angina pectoris: Secondary | ICD-10-CM

## 2021-07-20 DIAGNOSIS — R3121 Asymptomatic microscopic hematuria: Secondary | ICD-10-CM

## 2021-07-20 DIAGNOSIS — E785 Hyperlipidemia, unspecified: Secondary | ICD-10-CM

## 2021-07-20 DIAGNOSIS — Z1211 Encounter for screening for malignant neoplasm of colon: Secondary | ICD-10-CM | POA: Diagnosis not present

## 2021-07-20 LAB — POCT URINALYSIS DIPSTICK
Bilirubin, UA: NEGATIVE
Glucose, UA: NEGATIVE
Ketones, UA: NEGATIVE
Nitrite, UA: NEGATIVE
Protein, UA: POSITIVE — AB
Spec Grav, UA: 1.015 (ref 1.010–1.025)
Urobilinogen, UA: 0.2 E.U./dL
pH, UA: 5 (ref 5.0–8.0)

## 2021-07-20 MED ORDER — BISOPROLOL FUMARATE 5 MG PO TABS: 5.0000 mg | ORAL_TABLET | Freq: Every day | ORAL | 2 refills | Status: DC

## 2021-07-20 MED ORDER — KETOCONAZOLE 200 MG PO TABS: 400.0000 mg | ORAL_TABLET | Freq: Every day | ORAL | 1 refills | Status: DC

## 2021-07-20 MED ORDER — ROSUVASTATIN CALCIUM 20 MG PO TABS: 20.0000 mg | ORAL_TABLET | Freq: Every day | ORAL | 2 refills | Status: DC

## 2021-07-20 NOTE — Patient Instructions (Addendum)
Ketoconazole 2 pills once for the rash. If returns, then refill ordered once.  I will refer you for colonoscopy.  Depending on urine tests, can decide on meds or urology eval. I will let you know.  Return to the clinic or go to the nearest emergency room if any of your symptoms worsen or new symptoms occur.     Tinea Versicolor  Tinea versicolor is a common fungal infection of the skin. It causes a rash that appears as light or dark patches on the skin. The rash most often occurs on the chest, back, neck, or upper arms. This condition is more common duringwarm weather. Other than affecting how your skin looks, tinea versicolor usually does not cause other problems. In most cases, the infection goes away in a few weeks with treatment. It may take a few months for the patches on your skin to returnto your usual skin color. What are the causes? This condition occurs when a type of fungus that is normally present on the skin starts to overgrow. This fungus is a kind of yeast. The exact cause of the overgrowth is not known. This condition cannot be passed from one person to another (it is not contagious). What increases the risk? This condition is more likely to develop when certain factors are present, such as: Heat and humidity. Sweating too much. Hormone changes. Oily skin. A weak disease-fighting system (immunesystem). What are the signs or symptoms? Symptoms of this condition include: A rash of light or dark patches on your skin. The rash may have: Patches of tan or pink spots (on light skin). Patches of white or brown spots (on dark skin). Patches of skin that do not tan. Well-marked edges. Scales on the discolored areas. Mild itching. How is this diagnosed? A health care provider can usually diagnose this condition by looking at your skin. During the exam, he or she may use ultraviolet (UV) light to see how much of your skin has been affected. In some cases, a skin sample may be  taken by scraping the rash. This sample will be viewed under a microscope to check foryeast overgrowth. How is this treated? Treatment for this condition may include: Dandruff shampoo that is applied to the affected skin during showers or bathing. Over-the-counter medicated skin cream, lotion, or soaps. Prescription antifungal medicine in the form of skin cream or pills. Medicine to help reduce itching. Follow these instructions at home: Take over-the-counter and prescription medicines only as told by your health care provider. Apply dandruff shampoo to the affected area if your health care provider told you to do that. You may be instructed to scrub the affected skin for several minutes each day. Do not scratch the affected area of skin. Avoid hot and humid conditions. Do not use tanning booths. Try to avoid sweating a lot. Contact a health care provider if: Your symptoms get worse. You have a fever. You have redness, swelling, or pain at the site of your rash. You have fluid or blood coming from your rash. Your rash feels warm to the touch. You have pus or a bad smell coming from your rash. Your rash returns (recurs) after treatment. Summary Tinea versicolor is a common fungal infection of the skin. It causes a rash that appears as light or dark patches on the skin. The rash most often occurs on the chest, back, neck, or upper arms. A health care provider can usually diagnose this condition by looking at your skin. Treatment may include applying shampoo  to the skin and taking or applying medicines. This information is not intended to replace advice given to you by your health care provider. Make sure you discuss any questions you have with your healthcare provider. Document Revised: 09/05/2020 Document Reviewed: 09/06/2020 Elsevier Patient Education  2022 [employer].

## 2021-07-20 NOTE — Progress Notes (Signed)
Subjective:  Patient ID: Bruce Williams, male    DOB: 07-28-1951  Age: 70 y.o. 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 Told had blood and protein in urine. No visible hematuria. No dysuria. Some increased urinary frequency throughout the years with getting older. No recent changes.  Occasional nocturia, not nightly.  He does have a history of hypertension, CAD. BP Readings from Last 3 Encounters:  07/20/21 138/76  03/08/21 119/70  08/01/20 134/80  Cardiologist - Dr. Ellyn Hack. On statin, with crestor, bisoprolol for htn. Labs in April.   No results found for: HGBA1C glucose normal at 91 in April.  No results found for: PSA1, PSA possible prostate test with prior PCP years ago - normal. Dr. Jeralene Huff. Told had slightly enlarged prostate, no hx of CA known.  No CP/DOE.    Lab Results  Component Value Date   CHOL 124 03/08/2021   HDL 51 03/08/2021   LDLCALC 60 03/08/2021   TRIG 59 03/08/2021   CHOLHDL 2.4 03/08/2021   Lab Results  Component Value Date   CREATININE 1.00 03/08/2021     Right arm rash: Same rash every year at this time - during the summer. Arms, sometimes on back, flank. Mostly on arm. Past 2 weeks. Tx:otc ointment -tinactin?   HM: Colonoscopy in 2016 - path report 04/28/15 - tubular adenoma and hyperplastic polyps.   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/16/2021   OSA on CPAP 03/08/2021   Abnormal stress electrocardiogram test using treadmill 08/07/2020    Essential hypertension 08/31/2018   Irregular heart beats 08/31/2018   Encounter for physical examination related to employment 08/31/2018   Coronary artery disease involving native heart without angina pectoris    Hyperlipidemia with target LDL less than 70    Past Medical History:  Diagnosis Date   Coronary artery disease involving native heart without angina pectoris 1998   No angina since MI in 1998 - Non-ischemic Stress Echo 08/2017   History [place] [address]   [place]) - BMS PCI RCA (inferoposterior Akinesis on Echo).   Hyperlipidemia with target low density lipoprotein (LDL) cholesterol less than 70 mg/dL    Myocardial infarction ([place])    [employer] 02/06/2021   OSA on CPAP    Sleep apnea    [employer] 02/06/2021   Past Surgical History:  Procedure Laterality Date   ANKLE FRACTURE SURGERY Right 1980's   TREADMILL STRESS ECHO  09/17/2017   Pre-stress normal LV function at rest but no inferior posterior akinesis..  Exercised for: 35 min.  7.4 METS --> fair functional capacity.  Inferior posterior wall akinesis at rest consistent with prior MI --> maintained inferior posterior akinesis with exertion.  No evidence of ischemia.   No Known Allergies Prior to Admission medications   Medication Sig Start Date End Date Taking? Authorizing Provider  Ascorbic Acid (VITAMIN C) 1000 MG tablet Take 1,000 mg  by mouth daily.   Yes [provider]  aspirin EC 81 MG tablet Take 81 mg by mouth daily.   Yes [provider]  BLACK CURRANT SEED OIL PO Take 1,250 mg by mouth.   Yes [provider]  co-enzyme Q-10 30 MG capsule Take 30 mg by mouth 3 (three) times daily.   Yes [provider]  Multiple Vitamin (MULTI-VITAMINS) TABS Take by mouth.   Yes [provider]  Multiple Vitamins-Minerals (A THRU Z ULTIMATE MENS PO) Take by mouth.   Yes [provider]  Probiotic Product (PROBIOTIC MATURE ADULT PO) Take 94 mg by  mouth.   Yes [provider]  triamcinolone cream (KENALOG) 0.1 % Apply to patches of dry skin 2 times daily as needed 07/21/17  Yes [provider]  TURMERIC PO Take by mouth.   Yes [provider]  Vitamin D, Ergocalciferol, (DRISDOL) 1.25 MG (50000 UNIT) CAPS capsule Take 50,000 Units by mouth every 7 (seven) days.   Yes [provider]  bisoprolol (ZEBETA) 5 MG tablet Take 1 tablet (5 mg total) by mouth daily. 07/20/21   [name], MD  rosuvastatin (CRESTOR) 20 MG tablet Take 1 tablet (20 mg total) by mouth daily. 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  He lives with his current wife of 9 years.      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  [address] [place] based out of [employer]   Social Determinants of Health   Financial Resource Strain: Not on file  Food Insecurity: Not on file  Transportation Needs: Not on file  Physical Activity: Not on file  Stress: Not on file  Social Connections: Not on file  Intimate Partner Violence: Not on file    Review of Systems Per HPI.   Objective:   Vitals:   07/20/21 0901  BP: 138/76  Pulse: 66  Resp: 17  Temp: 98.2 F (36.8 C)  TempSrc: Temporal  SpO2: 96%  Weight: (!) 332 lb 9.6  oz (150.9 kg)  Height: '5\' 11"'$  (1.803 m)     Physical Exam Vitals reviewed.  Constitutional:      Appearance: He is well-developed.  HENT:     Head: Normocephalic and atraumatic.  Neck:     Vascular: No carotid bruit or JVD.  Cardiovascular:     Rate and Rhythm: Normal rate and regular rhythm.     Heart sounds: Normal heart sounds. No murmur heard. Pulmonary:     Effort: Pulmonary effort is normal.     Breath sounds: Normal breath sounds. No rales.  Musculoskeletal:     Right lower  leg: No edema.     Left lower leg: No edema.  Skin:    General: Skin is warm and dry.     Findings: Rash (see photo of R arm, simalar area left arm, few patches chest/back.) present.  Neurological:     Mental Status: He is alert and oriented to person, place, and time.  Psychiatric:        Mood and Affect: Mood normal.     Results for orders placed or performed in visit on 07/20/21  POCT Urinalysis Dipstick  Result Value Ref Range   Color, UA yellow    Clarity, UA clear    Glucose, UA Negative Negative   Bilirubin, UA Negative    Ketones, UA Negative    Spec Grav, UA 1.015 1.010 - 1.025   Blood, UA large    pH, UA 5.0 5.0 - 8.0   Protein, UA Positive (A) Negative   Urobilinogen, UA 0.2 0.2 or 1.0 E.U./dL   Nitrite, UA Negative    Leukocytes, UA Moderate (2+) (A) Negative   Appearance     Odor       Assessment & Plan:  Bruce Williams is a 70 y.o. male . Proteinuria, unspecified type - Plan: POCT Urinalysis Dipstick, Urine Microscopic, PSA, Urine Culture Asymptomatic microscopic hematuria - Plan: Urine Microscopic, PSA, Urine Culture Pyuria - Plan: Urine Microscopic, PSA, Urine Culture  -Asymptomatic, episodic urinary frequency but unchanged for some time.  Check urine micro, PSA, urine culture.  RTC precautions if symptomatic.  Consider urology eval depending on results.  Hyperlipidemia, unspecified hyperlipidemia type - Plan: rosuvastatin (CRESTOR) 20 MG tablet, CANCELED:  Comprehensive metabolic panel, CANCELED: Lipid panel  -Continue Crestor with follow-up next few months for repeat labs.  Same dose.  Coronary artery disease involving native heart without angina pectoris, unspecified vessel or lesion type - Plan: rosuvastatin (CRESTOR) 20 MG tablet, bisoprolol (ZEBETA) 5 MG tablet  -Asymptomatic, continue beta-blocker for hypertension and CAD as well as statin.  Tinea versicolor - Plan: ketoconazole (NIZORAL) 200 MG tablet  -Rash consistent with tinea versicolor on arms, ketoconazole 400 mg x 1 with option of repeat dose if recurrence.  Screen for colon cancer - Plan: Ambulatory referral to Gastroenterology Tubular adenoma - Plan: Ambulatory referral to Gastroenterology  -Based on pathology from previous colonoscopy should be due for repeat testing, refer to GI.  Meds ordered this encounter  Medications   rosuvastatin (CRESTOR) 20 MG tablet    Sig: Take 1 tablet (20 mg total) by mouth daily.    Dispense:  90 tablet    Refill:  2   bisoprolol (ZEBETA) 5 MG tablet    Sig: Take 1 tablet (5 mg total) by mouth daily.    Dispense:  90 tablet    Refill:  2   ketoconazole (NIZORAL) 200 MG tablet    Sig: Take 2 tablets (400 mg total) by mouth daily.    Dispense:  2 tablet    Refill:  1   Patient Instructions  Ketoconazole 2 pills once for the rash. If returns, then refill ordered once.  I will refer you for colonoscopy.  Depending on urine tests, can decide on meds or urology eval. I will let you know.  Return to the clinic or go to the nearest emergency room if any of your symptoms worsen or new symptoms occur.     Tinea Versicolor  Tinea versicolor is a common fungal infection of the skin. It causes a rash that appears as light or  dark patches on the skin. The rash most often occurs on the chest, back, neck, or upper arms. This condition is more common duringwarm weather. Other than affecting how your skin looks, tinea versicolor usually does not  cause other problems. In most cases, the infection goes away in a few weeks with treatment. It may take a few months for the patches on your skin to returnto your usual skin color. What are the causes? This condition occurs when a type of fungus that is normally present on the skin starts to overgrow. This fungus is a kind of yeast. The exact cause of the overgrowth is not known. This condition cannot be passed from one person to another (it is not contagious). What increases the risk? This condition is more likely to develop when certain factors are present, such as: Heat and humidity. Sweating too much. Hormone changes. Oily skin. A weak disease-fighting system (immunesystem). What are the signs or symptoms? Symptoms of this condition include: A rash of light or dark patches on your skin. The rash may have: Patches of tan or pink spots (on light skin). Patches of white or brown spots (on dark skin). Patches of skin that do not tan. Well-marked edges. Scales on the discolored areas. Mild itching. How is this diagnosed? A health care provider can usually diagnose this condition by looking at your skin. During the exam, he or she may use ultraviolet (UV) light to see how much of your skin has been affected. In some cases, a skin sample may be taken by scraping the rash. This sample will be viewed under a microscope to check foryeast overgrowth. How is this treated? Treatment for this condition may include: Dandruff shampoo that is applied to the affected skin during showers or bathing. Over-the-counter medicated skin cream, lotion, or soaps. Prescription antifungal medicine in the form of skin cream or pills. Medicine to help reduce itching. Follow these instructions at home: Take over-the-counter and prescription medicines only as told by your health care provider. Apply dandruff shampoo to the affected area if your health care provider told you to do that. You may be instructed to scrub  the affected skin for several minutes each day. Do not scratch the affected area of skin. Avoid hot and humid conditions. Do not use tanning booths. Try to avoid sweating a lot. Contact a health care provider if: Your symptoms get worse. You have a fever. You have redness, swelling, or pain at the site of your rash. You have fluid or blood coming from your rash. Your rash feels warm to the touch. You have pus or a bad smell coming from your rash. Your rash returns (recurs) after treatment. Summary Tinea versicolor is a common fungal infection of the skin. It causes a rash that appears as light or dark patches on the skin. The rash most often occurs on the chest, back, neck, or upper arms. A health care provider can usually diagnose this condition by looking at your skin. Treatment may include applying shampoo to the skin and taking or applying medicines. This information is not intended to replace advice given to you by your health care provider. Make sure you discuss any questions you have with your healthcare provider. Document Revised: 09/05/2020 Document Reviewed: 09/06/2020 Elsevier Patient Education  2022 [place],   Merri Ray, MD [facility] 07/20/21 10:01 AM

## 2021-07-25 ENCOUNTER — Telehealth: Payer: Self-pay

## 2021-07-25 NOTE — Telephone Encounter (Signed)
PA for Ketoconazole sent to plan 3:47 via CoverMyMeds.

## 2021-07-28 ENCOUNTER — Encounter: Payer: Self-pay | Admitting: Family Medicine

## 2021-08-02 NOTE — Progress Notes (Signed)
PATIENT: Bruce Williams DOB: 1951-11-17  REASON FOR VISIT: follow up HISTORY FROM: patient  Chief Complaint  Patient presents with   Obstructive Sleep Apnea    EMG RM 3, alone. Here for initial CPAP f/u for new machine. Pt reports doing well w/ therapies.      HISTORY OF PRESENT ILLNESS:  08/06/21 ALL:  Bruce Williams is a 70 y.o. male here today for follow up for OSA on CPAP.  He received new CPAP 05/2021. He is doing well. He reports nightly use. He does note air leak when sleeping on his side. He has tried multiple mask and most comfortable with medium nasal pillow. He is frustrated with DME as he report last three months of data is not available to him on [facility]. We are able to pull most recent data. 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      HISTORY: (copied from Dr Dohmeier's previous note)  HPI:  Bruce Williams is a 70 y.o. male patient, seen here in a referral from Dr. Carlota Raspberry for a sleep apnea evaluation-  I have the pleasure of meeting [name] last time in September 2019 at this time he already was a CPAP user and old the current machine since 2016.  He has been 100% compliant CPAP user 6 hours 41 minutes on average his minimum pressure setting is 5 maximum pressure setting 17 cmH2O with a 3 cm expiratory pressure relief.  His 95th percentile pressure is 14.3 cm so he is well within his current settings.  The residual AHI is 2.2 and there are no Cheyne-Stokes respirations measured.  A CPAP compliance report is 100% compliant CPAP compliance reports do not show the current baseline of apnea.  Hypoxemia.  His BMI has been 45 and has been in that range for a long time.  So I like for him to write a new prescription for his supplies, I also would order at least a home sleep test to have a new baseline and based on his current apnea count I would set the new machine probably similar to the old one.  The patient is okay with this approach.    08-04-2018 He has been  a DOT driver -  [facility]- now [facility] patient , with known OSA and CPAP treatment.  Mr. 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 He was first diagnosed in 2002 at [facility] [place] in hospital sleep lab.  He learned that he had sleep apnea and was treated with CPAP ever since.  Once he moved to [place] he continued treatment here at [facility].  He has been using an AutoSet air sense 10 machine, and this was just issued to him in May 2016.  It is set between 5 and 15 cmH2O with 3 cm EPR, he is using on average 6 hours and 35 minutes of CPAP therapy nightly, 100% compliance by download and his residual AHI is 1.8.  He does however have high air leakage, the 95th percentile pressure is close to 14 cm which would mean that he could use a little bit higher pressure settings on his AutoSet.  He feels clinically that his CPAP therapy is sufficient he has not endorsed elevated sleepiness levels on the Epworth sleepiness score.  Today he endorses at 4 points, fatigue severity at 23 points, and the geriatric depression score was endorsed at 1 out of 15 points.  DOT related 90-day download shows equally 100% of use at time 99% by time over 4 hours, only one night to use the machine for 3-1/2 hours.  Average use of time 6 hours 26 minutes, same settings as above, residual AHI 2.0 with obstructive apneas being 0.5.  Similar air leak.   Chief complaint according to patient :He needs to transfer care and wanted to be in the [facility].  [name] has been a compliant CPAP user for well over 15 years, and he has been followed at [facility] in [facility] for the last 3 or 5 years.  That needs to be no change in settings, his current machine is 56.68 years old but I like for him to have a new nasal pillow fitted.  The ResMed air fit P 10 is very easily sliding off and he may do  better with an N 30 I. I will follow him yearly until he needs a new machine.    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  He works about 10 to 12 hours daily in his retirement job.  He just transitioned to Medicare.  Occasional he will spend the evening at church functions, the couple goes bowling, movies goes to the movies but he is not necessarily physically active. His bedtime is usually between 10 and 11 PM unless he has a very early morning assignment.  He usually has no trouble to initiate sleep, on occasion he will be early in bed because of the early morning assignments, but he usually sleeps well in a cool, quiet and dark bedroom.  He does not have nocturia.  He sleeps on either side, mostly mostly the left. He sleeps on only one pillow for head support in a nonadjustable bed.  He rises in the morning usually at 6 AM or 7 AM, and he usually feels rested at the time.  He averages 7 to 8 hours of nocturnal sleep, most nights uninterrupted.   I have the pleasure of reviewing Mr. Bruce Williams's last sleep study which was a home sleep test performed at [facility] on [address] in [place] on Mar 30, 2015 by Dr. at bedtime Mohammed.  The AHI was 57.6, supine sleep position accentuated the AHI to 59.3, the majority was obstructive 28.5/h followed by centrals at 14.4/h.  Oxygen saturation low was 79%, duration of 31 minutes at or below 88%.  1 hour 49 minutes.  Pulse rate between 47 and 94 bpm apparently no arrhythmia was captured.  He was issued the auto set air sense 10 machine after this test.  I also have a pulmonology note available obstructive sleep apnea ordering AutoPap 5 through 15 cmH2O, risk factor BMI 45-49.9 in adult.  Based on these notes I should be able to transfer his CPAP care and supplies to a local DME.   Sleep medical history and family sleep history: CAD, MI in 1998 and  [place] in [place]. - reports ongoing  irregular heart beats.  OSA affected his father, another air force veteran. No sleep walking, enuresis , no night terrors.    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 The patient is married, has 3 adult children ( oldest 31 year old daughter with 3 children, 2 step children) , 5 grandchildren- former 22 year smoker- quit 5 years ago in 2014. Beer / wine - one  glass a week. Caffeine ; 2 cups of coffee a day- no sodas, not often iced tea.    REVIEW OF SYSTEMS: Out of a complete 14 system review of symptoms, the patient complains only of the following symptoms, none and all other reviewed systems are negative.  ESS: 4  ALLERGIES: No Known Allergies  HOME MEDICATIONS: Outpatient Medications Prior to Visit  Medication Sig Dispense Refill   Ascorbic Acid (VITAMIN C) 1000 MG tablet Take 1,000 mg by mouth daily.     aspirin EC 81 MG tablet Take 81 mg by mouth daily.     bisoprolol (ZEBETA) 5 MG tablet Take 1 tablet (5 mg total) by mouth daily. 90 tablet 2   BLACK CURRANT SEED OIL PO Take 1,250 mg by mouth.     co-enzyme Q-10 30 MG capsule Take 30 mg by mouth 3 (three) times daily.     ketoconazole (NIZORAL) 200 MG tablet Take 2 tablets (400 mg total) by mouth daily. 2 tablet 1   Multiple Vitamin (MULTI-VITAMINS) TABS Take by mouth.     Multiple Vitamins-Minerals (A THRU Z ULTIMATE MENS PO) Take by mouth.     Probiotic Product (PROBIOTIC MATURE ADULT PO) Take 94 mg by mouth.     rosuvastatin (CRESTOR) 20 MG tablet Take 1 tablet (20 mg total) by mouth daily. 90 tablet 2   triamcinolone cream (KENALOG) 0.1 % Apply to patches of dry skin 2 times daily as needed     TURMERIC PO Take by mouth.     Vitamin D, Ergocalciferol, (DRISDOL) 1.25 MG (50000 UNIT) CAPS capsule Take 50,000 Units by mouth every 7 (seven) days.     No facility-administered medications prior to visit.    PAST MEDICAL HISTORY: Past Medical History:  Diagnosis Date    Coronary artery disease involving native heart without angina pectoris 1998   No angina since MI in [place] Echo 08/2017   [place] [address]   [place] - BMS PCI RCA (inferoposterior Akinesis on Echo).   Hyperlipidemia with target low density lipoprotein (LDL) cholesterol less than 70 mg/dL    Myocardial infarction ([place])    Phreesia 02/06/2021   OSA on CPAP    Sleep apnea    Phreesia 02/06/2021    PAST SURGICAL HISTORY: Past Surgical History:  Procedure Laterality Date   ANKLE FRACTURE SURGERY Right 1980's   TREADMILL STRESS ECHO  09/17/2017   Pre-stress normal LV function at rest but no inferior posterior akinesis..  Exercised for: 35 min.  7.4 METS --> fair functional capacity.  Inferior posterior wall akinesis at rest consistent with prior MI --> maintained inferior posterior akinesis with exertion.  No evidence of ischemia.    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  He lives with his current wife of 9 years.      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  [place] a [place] radius based out of [employer]    Social Determinants of Health   Financial Resource Strain: Not on file  Food Insecurity: Not on file  Transportation Needs: Not on file  Physical Activity: Not on file  Stress: Not on file  Social Connections: Not on file  Intimate Partner Violence: Not on file     PHYSICAL EXAM  Vitals:   08/06/21 0846  BP: 136/82  Pulse: 86  Weight: (!) 333 lb 8 oz (151.3 kg)  Height: '5\' 11"'$  (1.803 m)   Body mass index is 46.51 kg/m.  Generalized: Well developed, in no acute distress  Cardiology: normal rate and rhythm, no murmur noted Respiratory: clear to auscultation bilaterally  Neurological examination  Mentation: Alert oriented to time, place, history taking. Follows all commands speech and language fluent Cranial nerve II-XII: Pupils were equal round reactive to light. Extraocular movements were full, visual field were full  Motor: The motor testing reveals 5 over 5 strength of all 4 extremities. Good symmetric motor tone is noted throughout.  Gait and station: Gait is normal.    DIAGNOSTIC DATA (LABS, IMAGING, TESTING) - I reviewed patient records, labs, notes, testing and imaging myself where available.  No flowsheet data found.   No results found for: WBC, HGB, HCT, MCV, PLT    Component Value Date/Time   NA 140 03/08/2021 1006   K 4.6 03/08/2021 1006   CL 104 03/08/2021 1006   CO2 21 03/08/2021 1006   GLUCOSE 91 03/08/2021 1006   BUN 19 03/08/2021 1006   CREATININE 1.00 03/08/2021 1006   CALCIUM 9.2 03/08/2021 1006   PROT 7.0 03/08/2021 1006   ALBUMIN 4.2 03/08/2021 1006   AST 18 03/08/2021 1006   ALT 15 03/08/2021 1006   ALKPHOS 44 03/08/2021 1006   BILITOT 0.4 03/08/2021 1006   GFRNONAA 85 06/28/2020 0808   GFRAA 98 06/28/2020 0808   Lab Results  Component Value Date   CHOL 124 03/08/2021   HDL 51 03/08/2021   LDLCALC 60 03/08/2021   TRIG 59 03/08/2021   CHOLHDL 2.4 03/08/2021   No results found for: HGBA1C No results found for: VITAMINB12 No  results found for: TSH   ASSESSMENT AND PLAN 70 y.o. year old male  has a past medical history of Coronary artery disease involving native heart without angina pectoris (1998), History of acute Inferior-Posterior wall MI ([address]), Hyperlipidemia with target low density lipoprotein (LDL) cholesterol less than 70 mg/dL, Myocardial infarction ([place]), OSA on CPAP, and Sleep apnea. here with     ICD-10-CM   1. OSA on CPAP  G47.33 For home use only DME continuous positive airway pressure (CPAP)   Z99.89        Frenchie Klusman is doing well on CPAP therapy. Compliance report reveals excellent compliance. He was encouraged to continue using CPAP nightly and for greater than 4 hours each night. He will continue to monitor for elevated leak at home. AHI is well managed. We will update supply orders as indicated. Risks of untreated sleep apnea review and education materials provided. Healthy lifestyle  habits encouraged. He will follow up in 1 year, sooner if needed. He verbalizes understanding and agreement with this plan.   Last CPAP set up date 05/2021.    Orders Placed This Encounter  Procedures   For home use only DME continuous positive airway pressure (CPAP)    Supplies    Order Specific Question:   Length of Need    Answer:   Lifetime    Order Specific Question:   Patient has OSA or probable OSA    Answer:   Yes    Order Specific Question:   Is the patient currently using CPAP in the home    Answer:   Yes    Order Specific Question:   Settings    Answer:   Other see comments    Order Specific Question:   CPAP supplies needed    Answer:   Mask, headgear, cushions, filters, heated tubing and water chamber      No orders of the defined types were placed in this encounter.     Debbora Presto, FNP-C 08/06/2021, 9:33 AM [facility] [address] [place], [place] [address] [phone]

## 2021-08-02 NOTE — Patient Instructions (Addendum)
Please continue using your CPAP regularly. While your insurance requires that you use CPAP at least 4 hours each night on 70% of the nights, I recommend, that you not skip any nights and use it throughout the night if you can. Getting used to CPAP and staying with the treatment long term does take time and patience and discipline. Untreated obstructive sleep apnea when it is moderate to severe can have an adverse impact on cardiovascular health and raise her risk for heart disease, arrhythmias, hypertension, congestive heart failure, stroke and diabetes. Untreated obstructive sleep apnea causes sleep disruption, nonrestorative sleep, and sleep deprivation. This can have an impact on your day to day functioning and cause daytime sleepiness and impairment of cognitive function, memory loss, mood disturbance, and problems focussing. Using CPAP regularly can improve these symptoms.  Continue to monitor for leak in your mask. Let me know if you have any trouble.   Follow up in 1 year

## 2021-08-06 ENCOUNTER — Ambulatory Visit (INDEPENDENT_AMBULATORY_CARE_PROVIDER_SITE_OTHER): Payer: Medicare Other | Admitting: Family Medicine

## 2021-08-06 ENCOUNTER — Other Ambulatory Visit: Payer: Self-pay

## 2021-08-06 ENCOUNTER — Other Ambulatory Visit (INDEPENDENT_AMBULATORY_CARE_PROVIDER_SITE_OTHER): Payer: Medicare Other

## 2021-08-06 ENCOUNTER — Telehealth: Payer: Self-pay | Admitting: Family Medicine

## 2021-08-06 ENCOUNTER — Encounter: Payer: Self-pay | Admitting: Family Medicine

## 2021-08-06 VITALS — BP 136/82 | HR 86 | Ht 71.0 in | Wt 333.5 lb

## 2021-08-06 DIAGNOSIS — R3121 Asymptomatic microscopic hematuria: Secondary | ICD-10-CM

## 2021-08-06 DIAGNOSIS — G4733 Obstructive sleep apnea (adult) (pediatric): Secondary | ICD-10-CM | POA: Diagnosis not present

## 2021-08-06 DIAGNOSIS — Z125 Encounter for screening for malignant neoplasm of prostate: Secondary | ICD-10-CM | POA: Diagnosis not present

## 2021-08-06 DIAGNOSIS — R809 Proteinuria, unspecified: Secondary | ICD-10-CM | POA: Diagnosis not present

## 2021-08-06 DIAGNOSIS — Z9989 Dependence on other enabling machines and devices: Secondary | ICD-10-CM

## 2021-08-06 LAB — URINALYSIS, MICROSCOPIC ONLY

## 2021-08-06 LAB — PSA: PSA: 0.96 ng/mL (ref 0.10–4.00)

## 2021-08-06 NOTE — Telephone Encounter (Signed)
Mr Tapscott has requested to donate his old CPAP machine to Dr Brett Fairy for use as she sees fit. I have notified Sheena with sleep lab. CPAP with supplies placed in lab.

## 2021-08-06 NOTE — Telephone Encounter (Signed)
I like to thank Mr. Gosey for his donation, we often have patients without insurance coverage or those who have lost a CPAP in a house fire, flood , etc.  We can put it to good use.  Larey Seat, MD

## 2021-08-07 LAB — URINE CULTURE
MICRO NUMBER:: 12361745
Result:: NO GROWTH
SPECIMEN QUALITY:: ADEQUATE

## 2021-08-07 NOTE — Progress Notes (Signed)
CM sent to [facility] 

## 2021-10-22 ENCOUNTER — Ambulatory Visit (INDEPENDENT_AMBULATORY_CARE_PROVIDER_SITE_OTHER): Payer: Medicare Other | Admitting: Family Medicine

## 2021-10-22 VITALS — BP 124/68 | HR 58 | Temp 98.3°F | Resp 16 | Ht 71.0 in | Wt 331.0 lb

## 2021-10-22 DIAGNOSIS — E785 Hyperlipidemia, unspecified: Secondary | ICD-10-CM | POA: Diagnosis not present

## 2021-10-22 DIAGNOSIS — I2510 Atherosclerotic heart disease of native coronary artery without angina pectoris: Secondary | ICD-10-CM | POA: Diagnosis not present

## 2021-10-22 DIAGNOSIS — I10 Essential (primary) hypertension: Secondary | ICD-10-CM | POA: Diagnosis not present

## 2021-10-22 DIAGNOSIS — Z131 Encounter for screening for diabetes mellitus: Secondary | ICD-10-CM

## 2021-10-22 DIAGNOSIS — R202 Paresthesia of skin: Secondary | ICD-10-CM

## 2021-10-22 NOTE — Progress Notes (Signed)
Subjective:  Patient ID: Bruce Williams, male    DOB: 09/14/1951  Age: 70 y.o. MRN: [id number]  CC:  Chief Complaint  Patient presents with   Hypertension    Pt here for follow up, pt denies physical sxs   Hyperlipidemia    Pt due for recheck no concerns     HPI [name] presents for   CAD: With hx of irregular heart rate. Treated with bisoprolol 5mg  qd.   No CP/DOE.  Cardiology - Dr. Ellyn Hack.  Wearing  Home readings: BP Readings from Last 3 Encounters:  10/22/21 124/68  08/06/21 136/82  07/20/21 138/76   Lab Results  Component Value Date   CREATININE 1.00 03/08/2021    Hyperlipidemia With hx of obesity.  Decreased portion sizes since wife had gastric procedure.  No soda/sweet tea. No alcohol.  Exercise:walking up and down road, inspection of truck, on breaks.  Crestor 20mg  qd. Occasional cramping in lower legs after driving, better with walking.same past few years, normal CPK in 2019. Better on Crestor.  10-12 hours behind the wheel - 5-6 days per week.  Some tingling in bottom of both feet past 3-5 weeks, comes and goes.  No foot weakness.  No back pain. On B12 with his MVI QD.  No results found for: HGBA1C, but glucose 91 in April.  Wt Readings from Last 3 Encounters:  10/22/21 (!) 331 lb (150.1 kg)  08/06/21 (!) 333 lb 8 oz (151.3 kg)  07/20/21 (!) 332 lb 9.6 oz (150.9 kg)     Lab Results  Component Value Date   CHOL 124 03/08/2021   HDL 51 03/08/2021   LDLCALC 60 03/08/2021   TRIG 59 03/08/2021   CHOLHDL 2.4 03/08/2021   Lab Results  Component Value Date   ALT 15 03/08/2021   AST 18 03/08/2021   ALKPHOS 44 03/08/2021   BILITOT 0.4 03/08/2021       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/16/2021   OSA on CPAP 03/08/2021   Abnormal stress electrocardiogram test using treadmill 08/07/2020   Essential hypertension 08/31/2018   Irregular  heart beats 08/31/2018   Encounter for physical examination related to employment 08/31/2018   Coronary artery disease involving native heart without angina pectoris    Hyperlipidemia with target LDL less than 70    Past Medical History:  Diagnosis Date   Coronary artery disease involving native heart without angina pectoris 1998   No angina since MI in 1998 - Non-ischemic Stress Echo 08/2017   [place] [address]   [place] - BMS PCI RCA (inferoposterior Akinesis on Echo).   Hyperlipidemia with target low density lipoprotein (LDL) cholesterol less than 70 mg/dL    Myocardial infarction ([place])    Phreesia 02/06/2021   OSA on CPAP    Sleep apnea    Phreesia 02/06/2021   Past Surgical History:  Procedure Laterality Date   ANKLE FRACTURE SURGERY Right 1980's   TREADMILL STRESS ECHO  09/17/2017   Pre-stress normal LV function at rest but no inferior posterior akinesis..  Exercised for: 35 min.  7.4 METS --> fair functional capacity.  Inferior posterior wall akinesis at rest consistent with prior MI --> maintained inferior posterior akinesis with exertion.  No evidence of ischemia.   No Known Allergies Prior to Admission medications   Medication Sig Start Date End Date Taking? Authorizing Provider  Ascorbic Acid (VITAMIN C)  1000 MG tablet Take 1,000 mg by mouth daily.   Yes [provider]  aspirin EC 81 MG tablet Take 81 mg by mouth daily.   Yes [provider]  bisoprolol (ZEBETA) 5 MG tablet Take 1 tablet (5 mg total) by mouth daily. 07/20/21  Yes [name], MD  BLACK CURRANT SEED OIL PO Take 1,250 mg by mouth.   Yes [provider]  co-enzyme Q-10 30 MG capsule Take 30 mg by mouth 3 (three) times daily.   Yes [provider]  ketoconazole (NIZORAL) 200 MG tablet Take 2 tablets (400 mg total) by mouth daily. 07/20/21  Yes [name], MD  Multiple Vitamin (MULTI-VITAMINS) TABS Take by mouth.   Yes [provider]  Multiple Vitamins-Minerals (A THRU Z ULTIMATE MENS PO) Take by mouth.   Yes [provider]  Probiotic Product (PROBIOTIC MATURE ADULT PO) Take 94 mg by mouth.   Yes [provider]  rosuvastatin (CRESTOR) 20 MG tablet Take 1 tablet (20 mg total) by mouth daily. 07/20/21  Yes [name], MD  triamcinolone cream (KENALOG) 0.1 % Apply to patches of dry skin 2 times daily as needed 07/21/17  Yes [provider]  TURMERIC PO Take by mouth.   Yes [provider]  Vitamin D, Ergocalciferol, (DRISDOL) 1.25 MG (50000 UNIT) CAPS capsule Take 50,000 Units by mouth every 7 (seven) days.   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  He lives with his current wife of 9 years.      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  [address] a [place] radius based out of [employer]   Social Determinants of Health   Financial Resource Strain: Not on file  Food Insecurity: Not on file  Transportation Needs: Not on file  Physical Activity: Not on file  Stress: Not on file  Social Connections: Not on file  Intimate Partner Violence: Not on file    Review of Systems  Constitutional:  Negative for fatigue and unexpected weight change.  Eyes:  Negative for visual  disturbance.  Respiratory:  Negative for cough, chest tightness and shortness of breath.   Cardiovascular:  Negative for chest pain, palpitations and leg swelling.  Gastrointestinal:  Negative for abdominal pain and blood in stool.  Neurological:  Negative for dizziness, light-headedness and headaches.    Objective:   Vitals:   10/22/21 1442  BP: 124/68  Pulse: (!) 58  Resp: 16  Temp: 98.3 F (36.8 C)  TempSrc: Temporal  SpO2: 97%  Weight: (!) 331 lb (150.1 kg)  Height: 5\' 11"  (1.803 m)     Physical Exam Vitals reviewed.  Constitutional:      Appearance: He is well-developed.  HENT:     Head: Normocephalic and atraumatic.  Neck:     Vascular: No carotid bruit or JVD.  Cardiovascular:     Rate and Rhythm: Normal rate and regular rhythm.     Heart sounds: Normal heart sounds. No murmur heard. Pulmonary:     Effort: Pulmonary effort is normal.     Breath sounds: Normal breath sounds. No rales.  Musculoskeletal:     Right lower leg: No edema.     Left lower leg: No edema.  Skin:    General: Skin is warm and dry.  Neurological:     Mental Status: He is alert and oriented to person, place, and time.  Psychiatric:        Mood and Affect: Mood normal.   Microfilament testing with decreased sensation on the lateral foot below the fourth the fifth phalanges.  Equal bilaterally.  Assessment & Plan:  Rucker Pridgeon is a 70 y.o. male . Hyperlipidemia, unspecified hyperlipidemia type - Plan: Lipid panel  -Tolerating current regimen, no changes  Essential hypertension - Plan: Comprehensive metabolic panel  -Tolerating current regimen.  Continue same with labs as above.  Tingling of both feet - Plan: TSH, B12, HM Diabetes Foot Exam Screening for diabetes mellitus  -New issue with decreased sensation and equal areas of both feet, question lumbar spine source versus metabolic versus peripheral neuropathy.  Check B12, TSH, previous glucose normal but we will recheck CMP.   Consider neurology eval, nerve conduction studies.  Denies true weakness or effect on driving at this time.  RTC precautions if any new or worsening symptoms.  Recheck 1 month.  No orders of the defined types were placed in this encounter.  Patient Instructions  Numbness/tingling in feet could be related to some pinched nerves from the back, as the decreased sensation was in the same location on both feet.  I will check some other blood work causes with your labs today.  Follow-up in the next 4 to 6 weeks to discuss further, sooner if any new or worsening symptoms.  Thanks for coming in today.    Signed,   Merri Ray, MD [place] 10/22/21 3:36 PM

## 2021-10-22 NOTE — Patient Instructions (Addendum)
Numbness/tingling in feet could be related to some pinched nerves from the back, as the decreased sensation was in the same location on both feet.  I will check some other blood work causes with your labs today.  Follow-up in the next 4 to 6 weeks to discuss further, sooner if any new or worsening symptoms.  Thanks for coming in today.

## 2021-10-23 LAB — COMPREHENSIVE METABOLIC PANEL
ALT: 15 U/L (ref 0–53)
AST: 16 U/L (ref 0–37)
Albumin: 4.2 g/dL (ref 3.5–5.2)
Alkaline Phosphatase: 40 U/L (ref 39–117)
BUN: 18 mg/dL (ref 6–23)
CO2: 24 mEq/L (ref 19–32)
Calcium: 9.6 mg/dL (ref 8.4–10.5)
Chloride: 105 mEq/L (ref 96–112)
Creatinine, Ser: 1.01 mg/dL (ref 0.40–1.50)
GFR: 76.33 mL/min (ref >60.00)
Glucose, Bld: 86 mg/dL (ref 70–99)
Potassium: 4.4 mEq/L (ref 3.5–5.1)
Sodium: 139 mEq/L (ref 135–145)
Total Bilirubin: 0.4 mg/dL (ref 0.2–1.2)
Total Protein: 7.2 g/dL (ref 6.0–8.3)

## 2021-10-23 LAB — LIPID PANEL
Cholesterol: 153 mg/dL (ref 0–200)
HDL: 56.7 mg/dL (ref >39.00)
LDL Cholesterol: 78 mg/dL (ref 0–99)
NonHDL: 96.18
Total CHOL/HDL Ratio: 3
Triglycerides: 89 mg/dL (ref 0.0–149.0)
VLDL: 17.8 mg/dL (ref 0.0–40.0)

## 2021-10-23 LAB — VITAMIN B12: Vitamin B-12: 421 pg/mL (ref 211–911)

## 2021-10-23 LAB — TSH: TSH: 1.86 u[IU]/mL (ref 0.35–5.50)

## 2021-11-22 ENCOUNTER — Ambulatory Visit (INDEPENDENT_AMBULATORY_CARE_PROVIDER_SITE_OTHER): Payer: Medicare Other | Admitting: Family Medicine

## 2021-11-22 ENCOUNTER — Encounter: Payer: Self-pay | Admitting: Family Medicine

## 2021-11-22 VITALS — BP 126/76 | HR 72 | Temp 98.1°F | Resp 17 | Ht 71.0 in | Wt 326.0 lb

## 2021-11-22 DIAGNOSIS — R202 Paresthesia of skin: Secondary | ICD-10-CM | POA: Diagnosis not present

## 2021-11-22 DIAGNOSIS — I2510 Atherosclerotic heart disease of native coronary artery without angina pectoris: Secondary | ICD-10-CM

## 2021-11-22 DIAGNOSIS — M5441 Lumbago with sciatica, right side: Secondary | ICD-10-CM | POA: Diagnosis not present

## 2021-11-22 NOTE — Progress Notes (Signed)
Subjective:  Patient ID: Bruce Williams, male    DOB: [birth year]  Age: 70 y.o. MRN: [id number]  CC:  Chief Complaint  Patient presents with   Peripheral Neuropathy    Pt here for 1 month recheck on bilateral numbness no real changes at this time     HPI Bruce Williams presents for   Foot dysesthesias: Discussed last month.  New issue at that time with decreased sensation on both feet.  Question lumbar spine source versus metabolic versus peripheral neuropathy.  B12, TSH, glucose were normal on CMP.  Denies weakness or effect on driving or feeling of pedals. No new symptoms.  Occasional low back pain, with radiation to R leg at times.  No bowel or bladder incontinence, no saddle anesthesia, no lower extremity weakness.    Results for orders placed or performed in visit on 10/22/21  Comprehensive metabolic panel  Result Value Ref Range   Sodium 139 135 - 145 mEq/L   Potassium 4.4 3.5 - 5.1 mEq/L   Chloride 105 96 - 112 mEq/L   CO2 24 19 - 32 mEq/L   Glucose, Bld 86 70 - 99 mg/dL   BUN 18 6 - 23 mg/dL   Creatinine, Ser 1.01 0.40 - 1.50 mg/dL   Total Bilirubin 0.4 0.2 - 1.2 mg/dL   Alkaline Phosphatase 40 39 - 117 U/L   AST 16 0 - 37 U/L   ALT 15 0 - 53 U/L   Total Protein 7.2 6.0 - 8.3 g/dL   Albumin 4.2 3.5 - 5.2 g/dL   GFR 76.33 >60.00 mL/min   Calcium 9.6 8.4 - 10.5 mg/dL  Lipid panel  Result Value Ref Range   Cholesterol 153 0 - 200 mg/dL   Triglycerides 89.0 0.0 - 149.0 mg/dL   HDL 56.70 >39.00 mg/dL   VLDL 17.8 0.0 - 40.0 mg/dL   LDL Cholesterol 78 0 - 99 mg/dL   Total CHOL/HDL Ratio 3    NonHDL 96.18   TSH  Result Value Ref Range   TSH 1.86 0.35 - 5.50 uIU/mL  B12  Result Value Ref Range   Vitamin B-12 421 211 - 911 pg/mL     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/16/2021   OSA on CPAP 03/08/2021   Abnormal stress electrocardiogram test using treadmill  08/07/2020   Essential hypertension 08/31/2018   Irregular heart beats 08/31/2018   Encounter for physical examination related to employment 08/31/2018   Coronary artery disease involving native heart without angina pectoris    Hyperlipidemia with target LDL less than 70    Past Medical History:  Diagnosis Date   Coronary artery disease involving native heart without angina pectoris 1998   No angina since MI in 1998 - Non-ischemic Stress Echo 08/2017   [place] [address]   [place] - BMS PCI RCA (inferoposterior Akinesis on Echo).   Hyperlipidemia with target low density lipoprotein (LDL) cholesterol less than 70 mg/dL    Myocardial infarction ([place])    Phreesia 02/06/2021   OSA on CPAP    Sleep apnea    Phreesia 02/06/2021   Past Surgical History:  Procedure Laterality Date   ANKLE FRACTURE SURGERY Right 1980's   TREADMILL STRESS ECHO  09/17/2017   Pre-stress normal LV function at rest but no inferior posterior akinesis..  Exercised for: 35 min.  7.4 METS --> fair functional capacity.  Inferior posterior  wall akinesis at rest consistent with prior MI --> maintained inferior posterior akinesis with exertion.  No evidence of ischemia.   No Known Allergies Prior to Admission medications   Medication Sig Start Date End Date Taking? Authorizing Provider  Ascorbic Acid (VITAMIN C) 1000 MG tablet Take 1,000 mg by mouth daily.   Yes [provider]  aspirin EC 81 MG tablet Take 81 mg by mouth daily.   Yes [provider]  bisoprolol (ZEBETA) 5 MG tablet Take 1 tablet (5 mg total) by mouth daily. 07/20/21  Yes [name], MD  BLACK CURRANT SEED OIL PO Take 1,250 mg by mouth.   Yes [provider]  co-enzyme Q-10 30 MG capsule Take 30 mg by mouth 3 (three) times daily.   Yes [provider]  ketoconazole (NIZORAL) 200 MG tablet Take 2 tablets (400 mg total) by mouth daily. 07/20/21  Yes [name], MD  Multiple  Vitamin (MULTI-VITAMINS) TABS Take by mouth.   Yes [provider]  Multiple Vitamins-Minerals (A THRU Z ULTIMATE MENS PO) Take by mouth.   Yes [provider]  Probiotic Product (PROBIOTIC MATURE ADULT PO) Take 94 mg by mouth.   Yes [provider]  rosuvastatin (CRESTOR) 20 MG tablet Take 1 tablet (20 mg total) by mouth daily. 07/20/21  Yes [name], MD  triamcinolone cream (KENALOG) 0.1 % Apply to patches of dry skin 2 times daily as needed 07/21/17  Yes [provider]  TURMERIC PO Take by mouth.   Yes [provider]  Vitamin D, Ergocalciferol, (DRISDOL) 1.25 MG (50000 UNIT) CAPS capsule Take 50,000 Units by mouth every 7 (seven) days.   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  He lives with his current wife of 9 years.      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  [address] [place] based out of [employer]   Social Determinants of Health   Financial Resource Strain: Not on file  Food Insecurity: Not on file  Transportation Needs: Not on file  Physical Activity: Not on file  Stress: Not on file  Social Connections: Not on file  Intimate Partner Violence: Not on file    Review of Systems  Per HPI.  Objective:   Vitals:    11/22/21 0859  BP: 126/76  Pulse: 72  Resp: 17  Temp: 98.1 F (36.7 C)  TempSrc: Temporal  SpO2: 95%  Weight: (!) 326 lb (147.9 kg)  Height: 5\' 11"  (1.803 m)   Physical Exam Vitals reviewed.  Constitutional:      General: He is not in acute distress.    Appearance: Normal  appearance. He is well-developed.  HENT:     Head: Normocephalic and atraumatic.  Cardiovascular:     Rate and Rhythm: Normal rate.  Pulmonary:     Effort: Pulmonary effort is normal.  Musculoskeletal:     Comments: Lumbar spine range of motion is intact, no focal bony tenderness or paraspinal tenderness, negative seated straight leg raise.  Strength intact and equal lower extremity testing including dorsiflexion, plantarflexion of feet.  Neurological:     Mental Status: He is alert and oriented to person, place, and time.  Psychiatric:        Mood and Affect: Mood normal.       Assessment & Plan:  Bruce Williams is a 70 y.o. male . Right-sided low back pain with right-sided sciatica, unspecified chronicity - Plan: DG Lumbar Spine Complete, Ambulatory referral to Neurology  Tingling of both feet - Plan: DG Lumbar Spine Complete, Ambulatory referral to Neurology  Foot dysesthesias, suspicious for lumbar spine source with episodic low back pain.  Denies current low back pain, weakness or red flag symptoms.  Previous blood work reassuring.  Will refer to neurology to consider nerve conduction studies.  Check x-ray of lumbar spine, but advanced imaging may be needed or neurosurgery/back specialist evaluation.  RTC precautions if any progression of symptoms including pain/burning, weakness or any difficulty with feel of pedals/operation of motor vehicle.  Advised to not drive if any progression of symptoms.  No orders of the defined types were placed in this encounter.  Patient Instructions  Foot symptoms might be related to your low back.  Have x-ray performed at [facility] location across from [facility].  I will refer you to neurology to evaluate for nerve conduction testing which can give [place] more information on what may be causing the numbness in the feet.  If you have any weakness, pain, tingling or any progression of symptoms be seen right away.  Do not drive if you have any further symptoms that affect your ability to feel the pedals or press on the pedals.  Let me know if there are questions.     Signed,   Merri Ray, MD [place] 11/22/21 9:47 AM

## 2021-11-22 NOTE — Patient Instructions (Signed)
Foot symptoms might be related to your low back.  Have x-ray performed at [facility] location across from [facility].  I will refer you to neurology to evaluate for nerve conduction testing which can give [place] more information on what may be causing the numbness in the feet.  If you have any weakness, pain, tingling or any progression of symptoms be seen right away.  Do not drive if you have any further symptoms that affect your ability to feel the pedals or press on the pedals.  Let me know if there are questions.

## 2021-11-23 ENCOUNTER — Other Ambulatory Visit: Payer: Medicare Other

## 2021-11-27 ENCOUNTER — Other Ambulatory Visit: Payer: Self-pay

## 2021-11-27 ENCOUNTER — Ambulatory Visit (INDEPENDENT_AMBULATORY_CARE_PROVIDER_SITE_OTHER)
Admission: RE | Admit: 2021-11-27 | Discharge: 2021-11-27 | Disposition: A | Discharge status: Home / Self Care | Payer: Medicare Other | Source: Ambulatory Visit | Attending: Family Medicine | Admitting: Family Medicine

## 2021-11-27 DIAGNOSIS — R202 Paresthesia of skin: Secondary | ICD-10-CM

## 2021-11-27 DIAGNOSIS — M5441 Lumbago with sciatica, right side: Secondary | ICD-10-CM | POA: Diagnosis not present

## 2021-11-27 DIAGNOSIS — M545 Low back pain, unspecified: Secondary | ICD-10-CM | POA: Diagnosis not present

## 2021-11-27 DIAGNOSIS — R200 Anesthesia of skin: Secondary | ICD-10-CM | POA: Diagnosis not present

## 2021-12-04 ENCOUNTER — Encounter: Payer: Self-pay | Admitting: Physician Assistant

## 2021-12-19 ENCOUNTER — Ambulatory Visit (INDEPENDENT_AMBULATORY_CARE_PROVIDER_SITE_OTHER): Payer: Medicare Other | Admitting: Physician Assistant

## 2021-12-19 ENCOUNTER — Encounter: Payer: Self-pay | Admitting: Physician Assistant

## 2021-12-19 VITALS — BP 129/70 | HR 74 | Ht 71.0 in | Wt 332.6 lb

## 2021-12-19 DIAGNOSIS — Z86010 Personal history of colonic polyps: Secondary | ICD-10-CM | POA: Diagnosis not present

## 2021-12-19 DIAGNOSIS — I499 Cardiac arrhythmia, unspecified: Secondary | ICD-10-CM

## 2021-12-19 DIAGNOSIS — Z9989 Dependence on other enabling machines and devices: Secondary | ICD-10-CM

## 2021-12-19 DIAGNOSIS — G4733 Obstructive sleep apnea (adult) (pediatric): Secondary | ICD-10-CM

## 2021-12-19 DIAGNOSIS — R9439 Abnormal result of other cardiovascular function study: Secondary | ICD-10-CM | POA: Diagnosis not present

## 2021-12-19 NOTE — Patient Instructions (Signed)
Follow up with cardiologist and then call to schedule colonoscopy appointment.   If you are age 69 or older, your body mass index should be between 23-30. Your Body mass index is 46.39 kg/m. If this is out of the aforementioned range listed, please consider follow up with your Primary Care Provider.  If you are age 51 or younger, your body mass index should be between 19-25. Your Body mass index is 46.39 kg/m. If this is out of the aformentioned range listed, please consider follow up with your Primary Care Provider.   ________________________________________________________  The [facility] providers would like to encourage you to use [facility] to communicate with providers for non-urgent requests or questions.  Due to long hold times on the telephone, sending your provider a message by [facility] may be a faster and more efficient way to get a response.  Please allow 48 business hours for a response.  Please remember that this is for non-urgent requests.  _______________________________________________________

## 2021-12-19 NOTE — Progress Notes (Signed)
Chief Complaint: Discuss colonoscopy  HPI:    Bruce Williams is a 71 yr old Caucasian male with past medical history as listed below including MI and CAD on aspirin, who was referred to me by [name], MD for discussion of a colonoscopy.    04/27/2015 patient had a colonoscopy with [facility] for colon cancer screening.  Colonoscopy showed 2 sessile polyps 6-7 mm removed from the left colon, 2 sessile polyps 6-7 mm removed in the sigmoid colon and several flat/hyperplastic like polyps in the rectum as well as grade 2 internal hemorrhoids.  Pathology showed fragments of tubular adenoma from the left/descending colon, hyperplastic polyps in the sigmoid and rectum.    08/31/2020 stress test with an EF of 47%.    Today, the patient presents to clinic and tells me he has a history of polyps and thinks he is due for repeat colonoscopy.  In general he has been doing well he is trying to lose some weight and has lost a total of 30 pounds so far.  Tells me that he occasionally has a flareup of his hemorrhoids but has soft regular stools and no abdominal pain.    Patient has not seen his cardiologist since September 2021.  He was told to follow-up with them in a year.    Denies fever, chills, weight loss, blood in his stool, change in bowel habits or symptoms that awaken him from sleep.  Past Medical History:  Diagnosis Date   Coronary artery disease involving native heart without angina pectoris 1998   No angina since MI in 1998 - Non-ischemic Stress Echo 08/2017   [place] [address]   [place] - BMS PCI RCA (inferoposterior Akinesis on Echo).   Hyperlipidemia with target low density lipoprotein (LDL) cholesterol less than 70 mg/dL    Myocardial infarction ([place])    Phreesia 02/06/2021   OSA on CPAP    Sleep apnea    Phreesia 02/06/2021    Past Surgical History:  Procedure Laterality Date   ANKLE FRACTURE SURGERY Right 1980's   TREADMILL STRESS ECHO   09/17/2017   Pre-stress normal LV function at rest but no inferior posterior akinesis..  Exercised for: 35 min.  7.4 METS --> fair functional capacity.  Inferior posterior wall akinesis at rest consistent with prior MI --> maintained inferior posterior akinesis with exertion.  No evidence of ischemia.    Current Outpatient Medications  Medication Sig Dispense Refill   Ascorbic Acid (VITAMIN C) 1000 MG tablet Take 1,000 mg by mouth daily.     aspirin EC 81 MG tablet Take 81 mg by mouth daily.     bisoprolol (ZEBETA) 5 MG tablet Take 1 tablet (5 mg total) by mouth daily. 90 tablet 2   BLACK CURRANT SEED OIL PO Take 1,250 mg by mouth.     co-enzyme Q-10 30 MG capsule Take 30 mg by mouth 3 (three) times daily.     ketoconazole (NIZORAL) 200 MG tablet Take 2 tablets (400 mg total) by mouth daily. 2 tablet 1   Multiple Vitamin (MULTI-VITAMINS) TABS Take by mouth.     Multiple Vitamins-Minerals (A THRU Z ULTIMATE MENS PO) Take by mouth.     Probiotic Product (PROBIOTIC MATURE ADULT PO) Take 94 mg by mouth.     rosuvastatin (CRESTOR) 20 MG tablet Take 1 tablet (20 mg total) by mouth daily. 90 tablet 2   triamcinolone cream (KENALOG) 0.1 % Apply to patches of dry skin  2 times daily as needed     TURMERIC PO Take by mouth.     Vitamin D, Ergocalciferol, (DRISDOL) 1.25 MG (50000 UNIT) CAPS capsule Take 50,000 Units by mouth every 7 (seven) days.     No current facility-administered medications for this visit.    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  He lives with his current wife of 9 years.      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  [address] [place] based out of [employer]   Social Determinants of Health   Financial Resource Strain: Not on file  Food Insecurity: Not on file  Transportation Needs: Not on file  Physical Activity: Not on file  Stress: Not on file  Social Connections: Not on file  Intimate Partner Violence: Not on file    Review of Systems:    Constitutional: No weight loss, fever or chills Skin: No rash  Cardiovascular: No chest pain Respiratory: No SOB  Gastrointestinal: See HPI and otherwise negative Genitourinary: No dysuria  Neurological: No headache, dizziness or syncope Musculoskeletal: No new muscle or joint pain Hematologic: No bleeding  Psychiatric: No history of depression or anxiety   Physical Exam:  Vital signs: BP 129/70    Pulse 74    Ht 5\' 11"  (1.803 m)    Wt (!) 332 lb 9.6 oz (150.9 kg)    SpO2 98%    BMI 46.39 kg/m    Constitutional:   Pleasant obese Caucasian male appears to be in NAD, Well developed, Well nourished, alert and cooperative Head:  Normocephalic and atraumatic. Eyes:   PEERL, EOMI. No icterus. Conjunctiva pink. Ears:  Normal auditory acuity. Neck:  Supple Throat: Oral cavity and pharynx without inflammation, swelling or lesion.  Respiratory: Respirations even and unlabored. Lungs clear to auscultation bilaterally.   No wheezes, crackles, or rhonchi.  Cardiovascular: Normal S1, S2. No MRG. Regular rate and rhythm. No peripheral edema, cyanosis or pallor.   Gastrointestinal:  Soft, nondistended, nontender. No rebound or guarding. Normal bowel sounds. No appreciable masses or hepatomegaly. Rectal:  Not performed.  Msk:  Symmetrical without gross deformities. Without edema, no deformity or joint abnormality.  Neurologic:  Alert and  oriented x4;  grossly normal neurologically.  Skin:   Dry and intact without significant lesions or rashes. Psychiatric: Demonstrates good judgement and reason without abnormal affect or behaviors.  MOST RECENT LABS AND IMAGING:  CMP  Component Value Date/Time   NA 139 10/22/2021 1544   NA 140 03/08/2021 1006   K 4.4 10/22/2021 1544   CL 105 10/22/2021 1544   CO2 24 10/22/2021 1544   GLUCOSE 86 10/22/2021 1544   BUN 18 10/22/2021 1544   BUN 19 03/08/2021 1006   CREATININE 1.01 10/22/2021 1544   CALCIUM 9.6 10/22/2021 1544   PROT 7.2 10/22/2021 1544   PROT 7.0 03/08/2021 1006   ALBUMIN 4.2 10/22/2021 1544   ALBUMIN 4.2 03/08/2021 1006   AST 16 10/22/2021 1544   ALT 15 10/22/2021 1544   ALKPHOS 40 10/22/2021 1544   BILITOT 0.4 10/22/2021 1544   BILITOT 0.4 03/08/2021 1006   GFRNONAA 85 06/28/2020 0808   GFRAA 98 06/28/2020 0808    Assessment: 1.  History of adenomatous polyps: Last colonoscopy in 2016 with multiple adenomas recommendations to repeat in 5 years 2.  History of CAD and previous MI as well as irregular heartbeats and previously abnormal stress EKG: Patient maintained on aspirin, MI was back in the 90s, he denies any current chest pain or shortness of breath but has not seen his cardiologist since 08/01/2020 with recommendations for them to follow-up in a year  Plan: 1.  At this time recommend the patient follow with his cardiologist to ensure that he is cleared from their standpoint given all of his risk factors.  Once he sees his cardiologist he can call us and to be scheduled for a direct colonoscopy in the [place].  He verbalized understanding. 2.  Did go ahead and discuss colonoscopy  details.  Patient agrees to proceed when he is cleared. 3.  Patient to follow in clinic after time of colonoscopy.  He was assigned to Dr. Lorenso Courier this morning.  Ellouise Newer, PA-C [facility] 12/19/2021, 8:31 AM  Cc: [name], MD

## 2021-12-20 NOTE — Progress Notes (Signed)
I agree with the assessment and plan as outlined by Bruce Williams. 

## 2022-01-31 ENCOUNTER — Encounter: Payer: Self-pay | Admitting: Neurology

## 2022-01-31 ENCOUNTER — Ambulatory Visit (INDEPENDENT_AMBULATORY_CARE_PROVIDER_SITE_OTHER): Payer: Medicare Other | Admitting: Neurology

## 2022-01-31 VITALS — BP 135/77 | HR 76 | Ht 71.0 in | Wt 325.0 lb

## 2022-01-31 DIAGNOSIS — M792 Neuralgia and neuritis, unspecified: Secondary | ICD-10-CM | POA: Diagnosis not present

## 2022-01-31 DIAGNOSIS — M5116 Intervertebral disc disorders with radiculopathy, lumbar region: Secondary | ICD-10-CM | POA: Diagnosis not present

## 2022-01-31 DIAGNOSIS — M5126 Other intervertebral disc displacement, lumbar region: Secondary | ICD-10-CM | POA: Diagnosis not present

## 2022-01-31 DIAGNOSIS — R0989 Other specified symptoms and signs involving the circulatory and respiratory systems: Secondary | ICD-10-CM

## 2022-01-31 NOTE — Progress Notes (Signed)
SLEEP MEDICINE CLINIC   Provider:  Larey Seat, M.D.   Primary Care Physician:  [name], MD   Referring Provider: [name], MD    Chief Complaint  Patient presents with   Consult    Rm 11, alone. Internal referral for foot numbness and lower back pn down R leg. Requesting nerve conduction testing to determine neuropathy cause. Neck pn and burning at base of neck. No recent injuries.     HPI: 01-31-2022: Bruce Williams is a 71 y.o. male patient, seen here in a referral from Dr. Carlota Raspberry for referral for foot numbness and lower back pain radiating down R leg.  Radiating outside of the leg into the toes. Across dorsum pedis between hallux and second toe.   Requesting nerve conduction testing to determine neuropathy cause. Neck pain and burning at base of neck. No recent injuries.   He is not a drinker, vapes , former smoker.   TSH, Vit B 12 , lipids, electrolytes and CMET were normal 09-2021. He reports arthritis pain, takes ibuprofen.            sleep apnea evaluation-  I have the pleasure of meeting Bruce Williams last time in September 2019 at this time he already was a CPAP user and old the current machine since 2016.  He has been 100% compliant CPAP user 6 hours 41 minutes on average his minimum pressure setting is 5 maximum pressure setting 17 cmH2O with a 3 cm expiratory pressure relief.  His 95th percentile pressure is 14.3 cm so he is well within his current settings.  The residual AHI is 2.2 and there are no Cheyne-Stokes respirations measured.  A CPAP compliance report is 100% compliant CPAP compliance reports do not show the current baseline of apnea.  Hypoxemia.  His BMI has been 45 and has been in that range for a long time.  So I like for him to write a new prescription for his supplies, I also would order at least a home sleep test to have a new baseline and based on his current apnea count I would set the new machine probably similar to the old one.   The patient is okay with this approach.     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 He was first diagnosed in 2002 at [facility] [place] in hospital sleep lab.  He learned that he had sleep apnea and was treated with CPAP ever since.  Once he moved to [place] he continued treatment here at [facility].  He has been using an AutoSet air sense 10 machine, and this was just issued to him in May 2016.  It is set between 5 and 15 cmH2O with 3 cm EPR, he is using on average 6 hours and 35 minutes of CPAP therapy nightly, 100% compliance by download and his residual AHI is 1.8.  He does however have high air leakage, the 95th percentile pressure is close to 14 cm which would mean that he could use a little bit higher pressure settings on his AutoSet.  He feels clinically that his CPAP therapy is sufficient he has not endorsed elevated sleepiness levels on the Epworth sleepiness score.  Today he endorses at 4 points, fatigue severity at 23 points, and the geriatric depression score  was endorsed at 1 out of 15 points.  DOT related 90-day download shows equally 100% of use at time 99% by time over 4 hours, only one night to use the machine for 3-1/2 hours.  Average use of time 6 hours 26 minutes, same settings as above, residual AHI 2.0 with obstructive apneas being 0.5.  Similar air leak.  Chief complaint according to patient :He needs to transfer care and wanted to be in the [facility].  Bruce Williams has been a compliant CPAP user for well over 15 years, and he has been followed at [facility] in [facility] for the last 3 or 5 years.  That needs to be no change in settings, his current machine is 67.69 years old but I like for him to have a new nasal pillow fitted.   The ResMed air fit P 10 is very easily sliding off and he may do better with an N 30 I. I will follow him yearly until he needs a new machine.    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  He works about 10 to 12 hours daily in his retirement job.  He just transitioned to Medicare.  Occasional he will spend the evening at church functions, the couple goes bowling, movies goes to the movies but he is not necessarily physically active. His bedtime is usually between 10 and 11 PM unless he has a very early morning assignment.  He usually has no trouble to initiate sleep, on occasion he will be early in bed because of the early morning assignments, but he usually sleeps well in a cool, quiet and dark bedroom.  He does not have nocturia.  He sleeps on either side, mostly mostly the left. He sleeps on only one pillow for head support in a nonadjustable bed.  He rises in the morning usually at 6 AM or 7 AM, and he usually feels rested at the time.  He averages 7 to 8 hours of nocturnal sleep, most nights uninterrupted.  I have the pleasure of reviewing Bruce Williams's last sleep study which was a home sleep test performed at [facility] on [address] in [place] on Mar 30, 2015 by Dr. at bedtime Mohammed.  The AHI was 57.6, supine sleep position accentuated the AHI to 59.3, the majority was obstructive 28.5/h followed by centrals at 14.4/h.  Oxygen saturation low was 79%, duration of 31 minutes at or below 88%.  1 hour 49 minutes.  Pulse rate between 47 and 94 bpm apparently no arrhythmia was captured.  He was issued the auto set air sense 10 machine after this test.  I also have a pulmonology note available obstructive sleep apnea ordering AutoPap 5 through 15 cmH2O, risk factor BMI 45-49.9 in adult.  Based on these notes I should be able to transfer his CPAP care and supplies to a local  DME.   Sleep medical history and family sleep history: CAD, MI in 1998 and stent [place] in [place]. - reports ongoing  irregular heart beats.  OSA affected his father, another air [employer] veteran. No sleep walking, enuresis , no night terrors.   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 The patient is married, has 3 adult children ( oldest 69 year old daughter with 3 children, 2 step children) , 5 grandchildren- former 1 year smoker- quit 5 years ago in  2014. Beer / wine - one glass a week. Caffeine ; 2 cups of coffee a day- no sodas, not often iced tea.     Review of Systems: Out of a complete 14 system review, the patient complains of only the following symptoms, and all other reviewed systems are negative.   Snoring, poor sleep if not on CPAP. Weight gain.  Epworth score: 3/ 24 points , Fatigue severity score: 23/ 63 points  , depression score 1/ 15   How likely are you to doze in the following situations: 0 = not likely, 1 = slight chance, 2 = moderate chance, 3 = high chance  Sitting and Reading? Watching Television? Sitting inactive in a public place (theater or meeting)? Lying down in the afternoon when circumstances permit? Sitting and talking to someone? Sitting quietly after lunch without alcohol? In a car, while stopped for a few minutes in traffic? As a passenger in a car for an hour without a break?  Total = 3  DOT wants a CPAP report once every 2 -year.    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     He lives with his current wife of 9 years.   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  [place] a mile radius based out of [employer]   Social Determinants of Health   Financial Resource Strain: Not on file  Food Insecurity: Not on file  Transportation Needs: Not on file  Physical Activity: Not on file  Stress: Not on file  Social Connections: Not on file  Intimate Partner Violence: Not on file    Family History  Problem Relation Age of Onset   CAD Mother    Diabetes Mother    CAD Father    Dementia Father     Past Medical History:  Diagnosis Date   Coronary artery disease involving native heart without angina pectoris 1998   No angina since MI in 1998 - Non-ischemic Stress Echo 08/2017   History [place] [address]   ([place]) - BMS PCI RCA (inferoposterior Akinesis on Echo).   Hyperlipidemia with target low density lipoprotein (LDL) cholesterol less than 70 mg/dL    Myocardial infarction ([place])    Phreesia 02/06/2021   OSA on CPAP    Sleep apnea    Phreesia 02/06/2021    Past Surgical History:  Procedure Laterality Date   ANKLE FRACTURE SURGERY Right 1980's   TREADMILL STRESS ECHO  09/17/2017   Pre-stress normal LV function at rest but no inferior posterior akinesis..  Exercised for: 35 min.  7.4  METS --> fair functional capacity.  Inferior posterior wall akinesis at rest consistent with prior MI --> maintained inferior posterior akinesis with exertion.  No evidence of ischemia.    Current Outpatient Medications  Medication Sig Dispense Refill   aspirin EC 81 MG tablet Take 81 mg by mouth daily.     bisoprolol (ZEBETA) 5 MG tablet Take 1 tablet  (5 mg total) by mouth daily. 90 tablet 2   co-enzyme Q-10 30 MG capsule Take 30 mg by mouth 3 (three) times daily.     ketoconazole (NIZORAL) 200 MG tablet Take 2 tablets (400 mg total) by mouth daily. 2 tablet 1   Multiple Vitamin (MULTI-VITAMINS) TABS Take by mouth.     Probiotic Product (PROBIOTIC MATURE ADULT PO) Take 94 mg by mouth.     rosuvastatin (CRESTOR) 20 MG tablet Take 1 tablet (20 mg total) by mouth daily. 90 tablet 2   triamcinolone cream (KENALOG) 0.1 % Apply to patches of dry skin 2 times daily as needed     TURMERIC PO Take by mouth.     Vitamin D, Ergocalciferol, (DRISDOL) 1.25 MG (50000 UNIT) CAPS capsule Take 50,000 Units by mouth every 7 (seven) days.     No current facility-administered medications for this visit.    Allergies as of 01/31/2022   (No Known Allergies)    Vitals: BP 135/77    Pulse 76    Ht '5\' 11"'$  (1.803 m)    Wt (!) 325 lb (147.4 kg)    BMI 45.33 kg/m  Last Weight:  Wt Readings from Last 1 Encounters:  01/31/22 (!) 325 lb (147.4 kg)   OZY:YQMG mass index is 45.33 kg/m.     Last Height:   Ht Readings from Last 1 Encounters:  01/31/22 '5\' 11"'$  (1.803 m)    Physical exam:  General: The patient is awake, alert and appears not in acute distress. The patient is well groomed. Dentures full -lower Head: Normocephalic, atraumatic. Neck is supple. Mallampati 3-  very elongated uvula.   neck circumference:20.5 . Nasal airflow patent . Retrognathia is not seen.  Cardiovascular:  Regular rate and rhythm , without murmurs or carotid bruit, and without distended neck veins. Respiratory: Lungs are clear to auscultation. Skin:  With evidence of severe  right ankle edema. Hardware in place. Delayed capillary refill into all toes.  Trunk: BMI is 45.61. pyknic built.   The patient's posture is slightly stooped  Neurologic exam : The patient is awake and alert, oriented to place and time.   Attention span & concentration ability appears normal.  Speech is  fluent,  without dysarthria, dysphonia or aphasia.  Mood and affect are appropriate.  Cranial nerves: Intact taste and smell- Pupils are equal and briskly reactive to light. Facial motor strength is symmetric and his tongue and uvula move midline.  Shoulder shrug was symmetrical. He reports right shoulder pain.  Motor exam:  Normal tone, muscle bulk and symmetric strength in all extremities. Full grip strength  Sensory:  no vibration sense to tuning fork at either knee or ankles, nor toes.   Deep tendon reflexes: in the lower extremities are trace only . No patella reflex on the right.   He has a swollen fractured ankle and no achilles tendone reflex.  No babinski response.    IMPRESSION: 1. Grade 1 degenerative retrolisthesis at L2-3, L3-4, and L4-5. 2. Asymmetric left-sided disease at L4-5 and right-sided disease at L3-4 and L5-S1.   Assessment:  After physical and neurologic examination,  review of laboratory studies,  Personal review of imaging studies, reports of other /same  Imaging studies, results of polysomnography and / or neurophysiology testing and pre-existing records as far as provided in visit., my assessment is   1) . Internal referral for foot numbness and lower back  pain-  No recent injuries.   Patient established as a sleep clinic patient , referred today for NCS- he would not have needed to see me first for a NCV, this can be ordered by his NP after speaking to me.   Mr. Kernodle presents with significant ankle swelling actually both ankles are puffy but his right foot is more swollen than his left.  There is clear evidence of neuropathy as well as delayed capillary filling.  It takes a while for his blanch to to become pink again.  He does not have a Babinski response, and he cannot feel vibration anywhere at knee or below but it is preserved in the upper extremities.  Reflexes are trace only.  So this is a clear case of a peripheral neuropathy and it does not affect  just the right leg the numbness is in both feet and ascending.   The second condition is a radiculopathy originating at L4-L5 and S1, descending from the nerve root across hamstring lateral thigh towards the lateral ankle and then crossing the dorsum of the right foot in between the big toe and second toe.  This is a fairly classical distribution of a pinched nerve in the lower back.  The patient had a very imaging studies done that suggest that there is significant degenerative disc disease.  I am not sure as to the origin of his peripheral neuropathy however as he is neither diabetic nor does he have thyroid disease he is obese and that in itself is a risk factor for high inflammatory markers which can also cause neuropathy.  There is a significant decrease in pulse strength and I think his blood flow restriction may contribute to his neuropathy to a greater degree than assumed at this time.     The patient was advised of the nature of the diagnosed disorder , the treatment options and the  risks for general health and wellness arising from not treating the condition.   I spent more than 20 minutes of face to face time with the patient.  Greater than 50% of time was spent in counseling and coordination of care. We have discussed the diagnosis and differential and I answered the patient's questions.    Plan:  Treatment plan and additional workup :   He is not established with orthopedist. We will unfortunately need to refer him for NCV to another practice in [place].  His new CPAP has worked out fine- he was tested 02-2021 and his machine is not a year issued. This  is his fourth machine .   RV with sleep NP in 12 months.     Larey Seat, MD 05/25/2196, 5:88 PM  Certified in Neurology by ABPN Certified in [place] by [facility] [address] [place], [place] [address]

## 2022-02-04 DIAGNOSIS — Z20822 Contact with and (suspected) exposure to covid-19: Secondary | ICD-10-CM | POA: Diagnosis not present

## 2022-02-18 DIAGNOSIS — G629 Polyneuropathy, unspecified: Secondary | ICD-10-CM | POA: Diagnosis not present

## 2022-02-20 ENCOUNTER — Telehealth: Payer: Self-pay | Admitting: Neurology

## 2022-02-20 NOTE — Telephone Encounter (Signed)
sent to [facility] ph  # [phone] ?

## 2022-03-09 ENCOUNTER — Encounter: Payer: Self-pay | Admitting: Family Medicine

## 2022-03-11 ENCOUNTER — Telehealth: Payer: Self-pay | Admitting: Neurology

## 2022-03-11 NOTE — Telephone Encounter (Signed)
I am summarizing her study performed by Dr. Gwenlyn Perking back to go for the patient morbid 40 this is an EMG nerve conduction study performed on 02/18/2022. ? ?Electrodiagnostic testing of both lower extremities was performed as requested by Dr. Asencion Partridge Bruce Williams, there were bilaterally prolonged sural sensory responses with diminished amplitude noted the H reflexes were bilaterally preserved.  Normal tibial motor conduction was noted as well as normal right peroneal motor conduction in both lower extremities.  EMG study of selected leg muscles and paraspinal muscles was within normal limits. ? ?Impression the finding of abnormal sural sensory responses are consistent with an early peripheral and sensory polyneuropathy.  There is no plexopathy or lumbar radiculopathy superimposed. ? ?Conclusion:  ? ?For this kind of poorly neuropathy we usually recommend alpha lipoic acid that is a supplement as a supplement, symptomatic improvement to improvement can be achieved with Lyrica or gabapentin with Lyrica or gabapentin.Larey Seat, MD  ?

## 2022-03-11 NOTE — Telephone Encounter (Signed)
Received a report from Dr Brien Few with the pt's results from the NCV/EMG testing that was completed. Will have Dr Dohmeier review this testing and advise patients of results.  ?

## 2022-03-12 ENCOUNTER — Encounter: Payer: Self-pay | Admitting: Neurology

## 2022-03-12 DIAGNOSIS — Z20822 Contact with and (suspected) exposure to covid-19: Secondary | ICD-10-CM | POA: Diagnosis not present

## 2022-03-12 NOTE — Telephone Encounter (Signed)
See reply

## 2022-03-13 NOTE — Telephone Encounter (Signed)
Called the pt and reviewed the information and results with him. Pt is agreeable to starting alpha lipoic acid. Pt verbalized understanding. ?Pt had no questions at this time but was encouraged to call back if questions arise. ? ?

## 2022-03-18 ENCOUNTER — Encounter: Payer: Self-pay | Admitting: Cardiology

## 2022-03-18 ENCOUNTER — Ambulatory Visit (INDEPENDENT_AMBULATORY_CARE_PROVIDER_SITE_OTHER): Payer: Medicare Other | Admitting: Cardiology

## 2022-03-18 VITALS — BP 124/80 | HR 68 | Ht 71.0 in | Wt 328.0 lb

## 2022-03-18 DIAGNOSIS — R9439 Abnormal result of other cardiovascular function study: Secondary | ICD-10-CM | POA: Diagnosis not present

## 2022-03-18 DIAGNOSIS — I10 Essential (primary) hypertension: Secondary | ICD-10-CM

## 2022-03-18 DIAGNOSIS — E785 Hyperlipidemia, unspecified: Secondary | ICD-10-CM | POA: Diagnosis not present

## 2022-03-18 DIAGNOSIS — Z0289 Encounter for other administrative examinations: Secondary | ICD-10-CM

## 2022-03-18 DIAGNOSIS — G4733 Obstructive sleep apnea (adult) (pediatric): Secondary | ICD-10-CM

## 2022-03-18 DIAGNOSIS — Z6841 Body Mass Index (BMI) 40.0 and over, adult: Secondary | ICD-10-CM

## 2022-03-18 DIAGNOSIS — I2510 Atherosclerotic heart disease of native coronary artery without angina pectoris: Secondary | ICD-10-CM

## 2022-03-18 DIAGNOSIS — Z9989 Dependence on other enabling machines and devices: Secondary | ICD-10-CM

## 2022-03-18 NOTE — Progress Notes (Signed)
Primary Care Provider: [name], MD Cardiologist: Glenetta Hew, MD Electrophysiologist: None  Clinic Note: No chief complaint on file.  ===================================  ASSESSMENT/PLAN   Problem List Items Addressed This Visit       Cardiology Problems   Coronary artery disease involving native heart without angina pectoris - Primary (Chronic)    Most recent stress test was nonischemic.  For his DOT physical, he would need another test.  He himself is doing relatively well with no active angina symptoms.  He is on stable regimen.  Only able to tolerate low-dose bisoprolol but no other blood pressure medications.  Currently on modest dose of rosuvastatin but did not tolerate higher dose.  Would like to consider adding Zetia pending next labs.  Plan: For 2-year DOT physical follow-up needs a Myoview stress test.  Last time we checked 1 was October 2021-see results below.  Large inferior inferolateral infarct, no significant ischemia.  Plan: Lexiscan Myoview (I do not think with his back pain and radiculopathy he would be able to walk on the treadmill at this point).       Relevant Orders   EKG 12-Lead (Completed)   MYOCARDIAL PERFUSION IMAGING   Cardiac Stress Test: Informed Consent Details: Physician/Practitioner Attestation; Transcribe to consent form and obtain patient signature   Hyperlipidemia with target LDL less than 70 (Chronic)    For having history of infarct, he is doing better not quite at goal.  He is on rosuvastatin 20 mg daily which had been reduced from 40.  Did not tolerate the higher dose.  Plan is add Zetia 10 mg daily.  He should be due for lab recheck in about 2 or 3 months, would probably put off until July.  Plan: Continue current dose of bisoprolol, aspirin and rosuvastatin. Neck step would be to add Zetia       Essential hypertension (Chronic)    Blood pressure was well controlled today he is only on beta-blocker at  low-dose. Marland Kitchen Unless progressive increase, no plans for invasive therapy.         Other   Encounter for physical examination related to employment    On exam he is doing well.  Symptomatically he is doing well.  Unfortunately as part of DOT evaluation he needs a stress test.  Currently planning Lexiscan Myoview.  Otherwise on stable regimen with no active anginal symptoms.       Abnormal stress electrocardiogram test using treadmill (Chronic)    Unfortunately, in the past he did not have a diagnostic GXT/ETT.  Therefore we need to do a [employer].  He will get a walker trouble anyway because of his back.  With his obesity, stress echo would not be an option.  Plan: Leane Call  See inpatient form decision making/consent form.       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  Plan: Lexiscan Myoview.       Relevant Orders   MYOCARDIAL PERFUSION IMAGING   Cardiac Stress Test: Informed Consent Details: Physician/Practitioner Attestation; Transcribe to consent form and obtain patient signature  Other Visit Diagnoses     Class 3 severe obesity with serious comorbidity and body mass index (BMI) of 45.0 to 49.9 in adult, unspecified obesity type ([place])   (Chronic)     Relevant Orders   MYOCARDIAL PERFUSION IMAGING      ===================================  HPI:    Bruce Williams is a morbidly obesity 71 y.o. male with a PMH notable for CAD having PCI-RCA in 49 (while living in [place]), HTN, HLD with OSA-CPAP who presents today for ~18 month  f/u TO discuss need for DOT physical stress test.  He presents today at the request of [name], MD.  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  Previously followed by [facility] => had abnormal GXT last time, referred for Myoview.Marland Kitchen  Tlaloc Taddei was last seen in September 2021 after a GXT that was abnormal.  Referred for Myoview.  Only exercise he gets is walking around the truck.  Does not eat properly.  [employer].  Was in the process of being evaluated for possible bariatric surgery.  No cardiac symptoms other than baseline exertional dyspnea.. = Myoview reviewed below - Large Inferior-Inferolateral Infarct with NO ISCHEMIA.   Recent Hospitalizations: None  Reviewed  CV studies:    The following studies were reviewed today: (if available, images/films reviewed: From Epic Chart or Care Everywhere) (2 day) Lexiscan Myoview 08/30/2020: EF 45 to 54%.  Mild diffuse HK.  Large sized severe perfusion defect in the basal-apical inferior-inferolateral-and apical lateral location consistent with prior infarct in the inferolateral distribution.  Intermediate risk based on old infarct.  Prior infarct but no ischemia.  Interval History:   Teodor Prater returns here today for significant delayed follow-up visit.  He is due for his DOT physical in the fall.  He says he has at least 1 more, getting a physical done before he probably will change his job so he does not have to keep having the [employer] license renewal.  Kazumi has not been all that active lately has been doing a lot with lower back pain with right leg radiculopathy/pain.  As such she has become quite sedentary being relatively active.  He does have some exertional dyspnea but denies any chest tightness or pressure with rest or exertion. When I last saw him, he was considering the possibility of Gastric Sleeve Surgery.   With obesity, deconditioning, and back/hip  pain with radiculopathy, he has not been very active.  He has some mild lower extremity pitting edema at the end of the day, but not significant venous stasis findings.  No PND, or orthopnea. No irregular heartbeats palpitations.  He is trying to use his CPAP a little more frequently.  He also uses compression stockings because of end of day edema.  CV Review of Symptoms (Summary) Cardiovascular ROS: positive for - dyspnea on exertion, edema, irregular heartbeat, orthopnea, palpitations, and some mild fatigue/exercise intolerance. negative for - chest pain, orthopnea, palpitations, paroxysmal nocturnal dyspnea, shortness of breath, or syncope/near syncope or TIA/amaurosis fugax, claudication    REVIEWED OF SYSTEMS   Review of Systems  Constitutional:  Negative for malaise/fatigue (More slack and then lazy) and weight loss.  HENT:  Positive for congestion. Negative for nosebleeds.   Respiratory:  Positive for shortness of breath (Deconditioned.). Negative for cough and wheezing.   Cardiovascular:  Negative for claudication.  Gastrointestinal:  Negative for blood in stool and melena.  Genitourinary:  Negative for flank pain and  hematuria.  Musculoskeletal:  Positive for back pain and joint pain.       Right back-hip with right leg radiculopathy.  Neurological:  Negative for dizziness, focal weakness and weakness.  Psychiatric/Behavioral:  Negative for depression and memory loss. The patient is not nervous/anxious and does not have insomnia.    I have reviewed and (if needed) personally updated the patient's problem list, medications, allergies, past medical and surgical history, social and family history.   PAST MEDICAL HISTORY   Past Medical History:  Diagnosis Date   Coronary artery disease involving native heart without angina pectoris 1998   No angina since MI in [place] Echo 08/2017   History of acute Inferior-Posterior wall MI 1998   ([place]) - BMS PCI RCA  (inferoposterior Akinesis on Echo) & Large Sized - Severe Intesity Fixed Perfusion Defect in Basal-mid-apical Inferior-inferolateral & apical lateral wall c/w prior Infarct (no peri-infarct ischemia)   Hyperlipidemia with target low density lipoprotein (LDL) cholesterol less than 70 mg/dL    OSA on CPAP    Sleep apnea    Phreesia 02/06/2021    PAST SURGICAL HISTORY   Past Surgical History:  Procedure Laterality Date   ANKLE FRACTURE SURGERY Right 1980's   CORONARY/GRAFT ACUTE MI REVASCULARIZATION  1990   BMS PCI to RCA (in [place]) in setting of inferoposterior STEMI   EXERCISE TOLERANCE TEST  08/01/2020   Exercised 3:36 minutes-  Peak HR 137 bpm, peak BP 225/77 mmHg.  MPHR 153 bpm-achieved 89%.  5.3 METS.  Nondiagnostic EKG stress test for ischemia due to resting ST or T wave abnormalities.  Poor functional capacity, with hypertensive response to exercise.  Read As Intermediate Risk   NM MYOVIEW LTD  08/30/2020   EF 45 to 50% with mild diffuse HK.  Large size/severe defect in the basal-mid-apical inferior-inferolateral and apical lateral wall.  Consistent with prior MI in the inferolateral distribution.  No reversibility.  Infarct with no ischemia. => Intermediate Risk because of old infarct and mildly reduced EF, but no evidence of ischemia.   TREADMILL STRESS ECHO  09/17/2017   Pre-stress normal LV function at rest but no inferior posterior akinesis..  Exercised for: 35 min.  7.4 METS --> fair functional capacity.  Inferior posterior wall akinesis at rest consistent with prior MI --> maintained inferior posterior akinesis with exertion.  No evidence of ischemia.  Exercise Tolerance Test 08/01/2020: Exercised 3:36 minutes-  Peak HR 137 bpm, peak BP 225/77 mmHg.  MPHR 153 bpm-achieved 89%.  5.3 METS.  Nondiagnostic EKG stress test for ischemia due to resting ST or T wave abnormalities.  Poor functional capacity, with hypertensive response to exercise.  Read As Intermediate Risk Myoview 08/30/2020:  EF 45 to 50% with mild diffuse HK.  Large size/severe defect in the basal-mid-apical inferior-inferolateral and apical lateral wall.  Consistent with prior MI in the inferolateral distribution.  No reversibility.  Infarct with no ischemia. => Intermediate Risk because of old infarct and mildly reduced EF, but no evidence of ischemia.  Immunization History  Administered Date(s) Administered   Influenza,inj,Quad PF,6+ Mos 09/26/2016   Influenza-Unspecified 09/13/2015    MEDICATIONS/ALLERGIES   Current Meds  Medication Sig   aspirin EC 81 MG tablet Take 81 mg by mouth daily.   bisoprolol (ZEBETA) 5 MG tablet Take 1 tablet (5 mg total) by mouth daily.   co-enzyme Q-10 30 MG capsule Take 30 mg by mouth 3 (three) times daily.   ketoconazole (NIZORAL) 200 MG tablet Take 2  tablets (400 mg total) by mouth daily.   Multiple Vitamin (MULTI-VITAMINS) TABS Take by mouth.   Probiotic Product (PROBIOTIC MATURE ADULT PO) Take 94 mg by mouth.   rosuvastatin (CRESTOR) 20 MG tablet Take 1 tablet (20 mg total) by mouth daily.   triamcinolone cream (KENALOG) 0.1 % Apply to patches of dry skin 2 times daily as needed   TURMERIC PO Take by mouth.   Vitamin D, Ergocalciferol, (DRISDOL) 1.25 MG (50000 UNIT) CAPS capsule Take 50,000 Units by mouth every 7 (seven) days.    No Known Allergies  SOCIAL HISTORY/FAMILY HISTORY   Reviewed in Epic:  Pertinent findings:  Social History   Tobacco Use   Smoking status: Former    Types: Cigarettes    Quit date: 07/21/2009    Years since quitting: 12.7   Smokeless tobacco: Never   Tobacco comments:    currenty vapes (tabaco)  Vaping Use   Vaping Use: Never used  Substance Use Topics   Alcohol use: Not Currently   Drug use: Never   Social History   Social History Narrative   Married father 2 with 5 grandchildren and 2 great-grandchildren.     He lives with his current wife of 9 years.   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  Constitutional:      General: He is not in acute distress.    Appearance: Normal appearance. He is ill-appearing. He is not toxic-appearing.     Comments: Morbidly obese.  Well-groomed.  HENT:     Head: Normocephalic and atraumatic.  Neck:     Vascular: No carotid bruit (Radiated murmur) or JVD.  Cardiovascular:     Rate and Rhythm: Normal rate and regular rhythm.     Pulses: Decreased pulses (Diminished with palpable pedal pulses.).     Heart sounds: Heart sounds are distant. No murmur heard.   No friction rub. No gallop.     Comments: Normal S1 with somewhat split S2. Pulmonary:     Effort: Pulmonary effort is normal.     Breath sounds: Normal breath sounds. No rhonchi.  Abdominal:     General: Abdomen is flat.     Palpations: Abdomen is soft.     Tenderness: There is abdominal tenderness.     Comments: Obese.  Musculoskeletal:     Cervical back: Normal range of motion. No rigidity.     Right lower leg: No edema (1+).     Left lower leg: Edema (Trace) present.  Skin:    General: Skin is warm and dry.  Neurological:     General: No focal deficit present.     Mental Status: He is alert and oriented to person, place, and time.     Motor: No weakness.     Gait: Gait abnormal.  Psychiatric:        Mood and Affect: Mood normal.        Behavior: Behavior  normal.        Thought Content: Thought content normal.        Judgment: Judgment normal.     Adult ECG Report  Rate: 68;  Rhythm: normal sinus rhythm and inferior infarct, age undetermined.  Otherwise normal axis, intervals and durations. ;    Narrative Interpretation: Stable  Recent Labs:  reviewed  Lab Results  Component Value Date   CHOL 153 10/22/2021   HDL 56.70 10/22/2021   LDLCALC 78 10/22/2021   TRIG 89.0 10/22/2021   CHOLHDL 3 10/22/2021   Lab Results  Component Value Date   CREATININE 1.01 10/22/2021   BUN 18 10/22/2021   NA 139 10/22/2021   K 4.4 10/22/2021   CL 105 10/22/2021   CO2 24 10/22/2021     No results found for: HGBA1C Lab Results  Component Value Date   TSH 1.86 10/22/2021    ==================================================  COVID-19 Education: The signs and symptoms of COVID-19 were discussed with the patient and how to seek care for testing (follow up with PCP or arrange E-visit).    I spent a total of 42mnutes with the patient spent in direct patient consultation.  Additional time spent with chart review  / charting (studies, outside notes, etc): 14 min Total Time: 36 min  Current medicines are reviewed at length with the patient today.  (+/- concerns) n/a  Notice: This dictation was prepared with Dragon dictation along with smart phrase technology. Any transcriptional errors that result from this process are unintentional and may not be corrected upon review.  Studies Ordered:   Orders Placed This Encounter  Procedures   Cardiac Stress Test: Informed Consent Details: Physician/Practitioner Attestation; Transcribe to consent form and obtain patient signature   MYOCARDIAL PERFUSION IMAGING   EKG 12-Lead   Shared Decision Making/Informed Consentn The risks [chest pain, shortness of breath, cardiac arrhythmias, dizziness, blood pressure fluctuations, myocardial infarction, stroke/transient ischemic attack, nausea, vomiting, allergic reaction, radiation exposure, metallic taste sensation and life-threatening complications (estimated to be 1 in 10,000)], benefits (risk stratification, diagnosing coronary artery disease, treatment guidance) and alternatives of a nuclear stress test  were discussed in detail with Mr. FKuenzeland he agrees to proceed.   We will plan for 441-monthollow-up just in case stress test is abnormal.  No orders of the defined types were placed in this encounter.   Patient Instructions / Medication Changes & Studies & Tests Ordered   Patient Instructions  Medication Instructions:    No changes   *If you need a refill on your cardiac medications before your next appointment, please call your pharmacy*   Lab Work: Not needed   Testing/Procedures: [address] [facility]  [address] July  [address] Your physician has requested that you have a lexiscan myoview. Please follow instruction sheet, as given.    Follow-Up: [facility] your [facility] needs are our [facility].  As part of our continuing mission to provide you with exceptional heart care, we have created designated Provider Care Teams.  These Care Teams include your primary Cardiologist (physician) and Advanced Practice Providers (APPs -  Physician Assistants and Nurse Practitioners) who all work together to provide you with the care you need, when you need it.     Your next appointment:   4 month(s)     4 months(Aug 2023)  if abnormal lexiscan  12 month with Dr HaEllyn Hack The format for your next appointment:   In Person  Provider:   Sande Rives, PA-C, Jory Sims, DNP, ANP, or Diona Browner, NP    Then, Glenetta Hew, MD will plan to see you again in 12 month(s).     Glenetta Hew, M.D., M.S. Interventional Cardiologist   Pager # [phone] Phone # [phone] [address] [place], [place] [address]   Thank you for choosing Heartcare at [facility]!!

## 2022-03-18 NOTE — Patient Instructions (Addendum)
Medication Instructions:  ? ? ?No changes ? ? ?*If you need a refill on your cardiac medications before your next appointment, please call your pharmacy* ? ? ?Lab Work: ?Not needed ? ? ?Testing/Procedures: ?Will be schedule  at [address] in July  [address] ?Your physician has requested that you have a lexiscan myoview. Please follow instruction sheet, as given.  ? ? ?Follow-Up: ?At [facility], you and your health needs are our priority.  As part of our continuing mission to provide you with exceptional heart care, we have created designated Provider Care Teams.  These Care Teams include your primary Cardiologist (physician) and Advanced Practice Providers (APPs -  Physician Assistants and Nurse Practitioners) who all work together to provide you with the care you need, when you need it. ? ?  ? ?Your next appointment:   ?4 month(s)     4 months(Aug 2023)  if abnormal lexiscan  ?12 month with Dr Ellyn Hack  ? ?The format for your next appointment:   ?In Person ? ?Provider:   ?Sande Rives, PA-C, Jory Sims, DNP, ANP, or Diona Browner, NP    Then, Glenetta Hew, MD will plan to see you again in 12 month(s). ? ? ? ?

## 2022-03-20 ENCOUNTER — Encounter: Payer: Self-pay | Admitting: Cardiology

## 2022-03-21 ENCOUNTER — Other Ambulatory Visit: Payer: Self-pay | Admitting: Neurology

## 2022-03-21 MED ORDER — GABAPENTIN 100 MG PO CAPS: 100.0000 mg | ORAL_CAPSULE | Freq: Three times a day (TID) | ORAL | 5 refills | Status: DC | PRN

## 2022-03-23 DIAGNOSIS — Z20822 Contact with and (suspected) exposure to covid-19: Secondary | ICD-10-CM | POA: Diagnosis not present

## 2022-03-25 NOTE — Telephone Encounter (Signed)
With the plan was Zetia 10 mg p.o. daily; dispense 90 tablets, 3 refills. ? ?Glenetta Hew, MD ? ?

## 2022-03-26 DIAGNOSIS — Z20822 Contact with and (suspected) exposure to covid-19: Secondary | ICD-10-CM | POA: Diagnosis not present

## 2022-03-27 ENCOUNTER — Ambulatory Visit (HOSPITAL_COMMUNITY): Payer: Medicare Other

## 2022-03-27 ENCOUNTER — Encounter (HOSPITAL_COMMUNITY): Payer: Medicare Other

## 2022-03-28 MED ORDER — EZETIMIBE 10 MG PO TABS: 10.0000 mg | ORAL_TABLET | Freq: Every day | ORAL | 3 refills | Status: DC

## 2022-04-17 ENCOUNTER — Other Ambulatory Visit: Payer: Self-pay | Admitting: Family Medicine

## 2022-04-17 DIAGNOSIS — E785 Hyperlipidemia, unspecified: Secondary | ICD-10-CM

## 2022-04-17 DIAGNOSIS — I2510 Atherosclerotic heart disease of native coronary artery without angina pectoris: Secondary | ICD-10-CM

## 2022-04-24 ENCOUNTER — Encounter: Payer: Self-pay | Admitting: Cardiology

## 2022-04-24 NOTE — Assessment & Plan Note (Signed)
Unfortunately, in the past he did not have a diagnostic GXT/ETT.  Therefore we need to do a [employer].  He will get a walker trouble anyway because of his back.  With his obesity, stress echo would not be an option.  Plan: Leane Call  See inpatient form decision making/consent form.

## 2022-04-24 NOTE — Assessment & Plan Note (Signed)
Needs stress test for probably what may be his last DOT physical as well.  Plan: Lexiscan Myoview.

## 2022-04-24 NOTE — Assessment & Plan Note (Signed)
Most recent stress test was nonischemic.  For his DOT physical, he would need another test.  He himself is doing relatively well with no active angina symptoms.  He is on stable regimen.  Only able to tolerate low-dose bisoprolol but no other blood pressure medications.  Currently on modest dose of rosuvastatin but did not tolerate higher dose.  Would like to consider adding Zetia pending next labs.  Plan: For 2-year DOT physical follow-up needs a Myoview stress test.  Last time we checked 1 was October 2021-see results below.  Large inferior inferolateral infarct, no significant ischemia.  Plan: Lexiscan Myoview (I do not think with his back pain and radiculopathy he would be able to walk on the treadmill at this point).

## 2022-04-24 NOTE — Assessment & Plan Note (Signed)
For having history of infarct, he is doing better not quite at goal.  He is on rosuvastatin 20 mg daily which had been reduced from 40.  Did not tolerate the higher dose.  Plan is add Zetia 10 mg daily.  He should be due for lab recheck in about 2 or 3 months, would probably put off until July.  Plan: Continue current dose of bisoprolol, aspirin and rosuvastatin. Neck step would be to add Zetia

## 2022-04-24 NOTE — Assessment & Plan Note (Signed)
On exam he is doing well.  Symptomatically he is doing well.  Unfortunately as part of DOT evaluation he needs a stress test.  Currently planning Lexiscan Myoview.  Otherwise on stable regimen with no active anginal symptoms.

## 2022-04-24 NOTE — Assessment & Plan Note (Signed)
Blood pressure was well controlled today he is only on beta-blocker at low-dose. Marland Kitchen Unless progressive increase, no plans for invasive therapy.

## 2022-05-29 ENCOUNTER — Telehealth (HOSPITAL_COMMUNITY): Payer: Self-pay | Admitting: *Deleted

## 2022-05-29 NOTE — Telephone Encounter (Signed)
Left message on voicemail per DPR in reference to upcoming appointment scheduled on 06/05/22 with detailed instructions given per Myocardial Perfusion Study Information Sheet for the test. LM to arrive 15 minutes early, and that it is imperative to arrive on time for appointment to keep from having the test rescheduled. If you need to cancel or reschedule your appointment, please call the office within 24 hours of your appointment. Failure to do so may result in a cancellation of your appointment, and a $50 no show fee. Phone number given for call back for any questions. Kirstie Peri

## 2022-06-05 ENCOUNTER — Ambulatory Visit (HOSPITAL_COMMUNITY): Payer: Medicare Other | Attending: Internal Medicine

## 2022-06-05 ENCOUNTER — Encounter (HOSPITAL_COMMUNITY): Payer: Self-pay | Admitting: *Deleted

## 2022-06-05 DIAGNOSIS — I2510 Atherosclerotic heart disease of native coronary artery without angina pectoris: Secondary | ICD-10-CM | POA: Diagnosis not present

## 2022-06-05 DIAGNOSIS — Z0289 Encounter for other administrative examinations: Secondary | ICD-10-CM | POA: Insufficient documentation

## 2022-06-05 DIAGNOSIS — Z9989 Dependence on other enabling machines and devices: Secondary | ICD-10-CM | POA: Insufficient documentation

## 2022-06-05 DIAGNOSIS — G4733 Obstructive sleep apnea (adult) (pediatric): Secondary | ICD-10-CM | POA: Diagnosis not present

## 2022-06-05 DIAGNOSIS — R9439 Abnormal result of other cardiovascular function study: Secondary | ICD-10-CM | POA: Insufficient documentation

## 2022-06-05 DIAGNOSIS — Z6841 Body Mass Index (BMI) 40.0 and over, adult: Secondary | ICD-10-CM | POA: Insufficient documentation

## 2022-06-05 MED ORDER — TECHNETIUM TC 99M TETROFOSMIN IV KIT
29.4000 | PACK | Freq: Once | INTRAVENOUS | Status: AC | PRN
  Administered 2022-06-05: 29.4 via INTRAVENOUS

## 2022-06-11 ENCOUNTER — Ambulatory Visit (HOSPITAL_COMMUNITY): Payer: Medicare Other | Attending: Cardiology

## 2022-06-11 DIAGNOSIS — Z0289 Encounter for other administrative examinations: Secondary | ICD-10-CM | POA: Insufficient documentation

## 2022-06-11 DIAGNOSIS — R9439 Abnormal result of other cardiovascular function study: Secondary | ICD-10-CM | POA: Insufficient documentation

## 2022-06-11 DIAGNOSIS — I2510 Atherosclerotic heart disease of native coronary artery without angina pectoris: Secondary | ICD-10-CM | POA: Diagnosis not present

## 2022-06-11 LAB — MYOCARDIAL PERFUSION IMAGING
LV dias vol: 111 mL (ref 62–150)
LV sys vol: 59 mL
Nuc Stress EF: 47 %
Peak HR: 91 {beats}/min
Rest HR: 73 {beats}/min
Rest Nuclear Isotope Dose: 29.4 mCi
SDS: 2
SRS: 13
SSS: 16
ST Depression (mm): 0 mm
Stress Nuclear Isotope Dose: 32.1 mCi
TID: 0.87

## 2022-06-11 MED ORDER — REGADENOSON 0.4 MG/5ML IV SOLN
0.4000 mg | Freq: Once | INTRAVENOUS | Status: AC
  Administered 2022-06-11: 0.4 mg via INTRAVENOUS

## 2022-06-11 MED ORDER — TECHNETIUM TC 99M TETROFOSMIN IV KIT
32.1000 | PACK | Freq: Once | INTRAVENOUS | Status: AC | PRN
  Administered 2022-06-11: 32.1 via INTRAVENOUS

## 2022-06-17 ENCOUNTER — Ambulatory Visit: Payer: Medicare Other | Admitting: General Practice

## 2022-07-03 ENCOUNTER — Ambulatory Visit: Payer: Medicare Other | Admitting: General Practice

## 2022-07-19 ENCOUNTER — Other Ambulatory Visit: Payer: Self-pay | Admitting: Family Medicine

## 2022-07-19 DIAGNOSIS — E785 Hyperlipidemia, unspecified: Secondary | ICD-10-CM

## 2022-07-19 DIAGNOSIS — I2510 Atherosclerotic heart disease of native coronary artery without angina pectoris: Secondary | ICD-10-CM

## 2022-07-25 ENCOUNTER — Ambulatory Visit (INDEPENDENT_AMBULATORY_CARE_PROVIDER_SITE_OTHER): Payer: Medicare Other

## 2022-07-25 DIAGNOSIS — Z0000 Encounter for general adult medical examination without abnormal findings: Secondary | ICD-10-CM | POA: Diagnosis not present

## 2022-07-25 NOTE — Progress Notes (Signed)
Subjective:   Bruce Williams is a 71 y.o. male who presents for Medicare Annual/Subsequent preventive examination.  Review of Systems    Virtual Visit via Telephone Note  I connected with  Saadiq Poche on 07/25/22 at  4:00 PM EDT by telephone and verified that I am speaking with the correct person using two identifiers.  Location: Patient: Home Provider: [facility] Persons participating in the virtual visit: patient/Nurse Health Advisor   I discussed the limitations, risks, security and privacy concerns of performing an evaluation and management service by telephone and the availability of in person appointments. The patient expressed understanding and agreed to proceed.  Interactive audio and video telecommunications were attempted between this nurse and patient, however failed, due to patient having technical difficulties OR patient did not have access to video capability.  We continued and completed visit with audio only.  Some vital signs may be absent or patient reported.   Sheral Flow, LPN  Cardiac Risk Factors include: advanced age (>71mn, >>18women);family history of premature cardiovascular disease;obesity (BMI >30kg/m2)     Objective:    Today's Vitals   07/25/22 1615  PainSc: 3    There is no height or weight on file to calculate BMI.     07/25/2022    4:19 PM 02/09/2021    4:34 PM 01/03/2020    9:49 AM 11/02/2019    8:12 AM 08/31/2018    1:28 PM  Advanced Directives  Does Patient Have a Medical Advance Directive? No No No No No  Would patient like information on creating a medical advance directive? No - Patient declined Yes (MAU/Ambulatory/Procedural Areas - Information given) No - Patient declined Yes (ED - Information included in AVS) No - Patient declined    Current Medications (verified) Outpatient Encounter Medications as of 07/25/2022  Medication Sig   aspirin EC 81 MG tablet Take 81 mg by mouth daily.   bisoprolol (ZEBETA) 5 MG tablet  Take 1 tablet by mouth once daily   co-enzyme Q-10 30 MG capsule Take 30 mg by mouth 3 (three) times daily.   ezetimibe (ZETIA) 10 MG tablet Take 1 tablet (10 mg total) by mouth daily.   gabapentin (NEURONTIN) 100 MG capsule Take 1 capsule (100 mg total) by mouth 3 (three) times daily as needed.   ketoconazole (NIZORAL) 200 MG tablet Take 2 tablets (400 mg total) by mouth daily.   Multiple Vitamin (MULTI-VITAMINS) TABS Take by mouth.   Probiotic Product (PROBIOTIC MATURE ADULT PO) Take 94 mg by mouth.   rosuvastatin (CRESTOR) 20 MG tablet Take 1 tablet by mouth once daily   triamcinolone cream (KENALOG) 0.1 % Apply to patches of dry skin 2 times daily as needed   TURMERIC PO Take by mouth.   [DISCONTINUED] Vitamin D, Ergocalciferol, (DRISDOL) 1.25 MG (50000 UNIT) CAPS capsule Take 50,000 Units by mouth every 7 (seven) days.   No facility-administered encounter medications on file as of 07/25/2022.    Allergies (verified) Patient has no known allergies.   History: Past Medical History:  Diagnosis Date   Coronary artery disease involving native heart without angina pectoris 1998   No angina since MI in 1998 - Non-ischemic Stress Echo 08/2017   History of acute Inferior-Posterior wall MI 1998   [place] - BMS PCI RCA (inferoposterior Akinesis on Echo) & Large Sized - Severe Intesity Fixed Perfusion Defect in Basal-mid-apical Inferior-inferolateral & apical lateral wall c/w prior Infarct (no peri-infarct ischemia)   Hyperlipidemia with target low density lipoprotein (LDL)  cholesterol less than 70 mg/dL    OSA on CPAP    Sleep apnea    Phreesia 02/06/2021   Past Surgical History:  Procedure Laterality Date   ANKLE FRACTURE SURGERY Right 1980's   CORONARY/GRAFT ACUTE MI REVASCULARIZATION  1990   BMS PCI to RCA (in [place]) in setting of inferoposterior STEMI   EXERCISE TOLERANCE TEST  08/01/2020   Exercised 3:36 minutes-  Peak HR 137 bpm, peak BP 225/77 mmHg.  MPHR 153 bpm-achieved 89%.   5.3 METS.  Nondiagnostic EKG stress test for ischemia due to resting ST or T wave abnormalities.  Poor functional capacity, with hypertensive response to exercise.  Read As Intermediate Risk   NM MYOVIEW LTD  08/30/2020   EF 45 to 50% with mild diffuse HK.  Large size/severe defect in the basal-mid-apical inferior-inferolateral and apical lateral wall.  Consistent with prior MI in the inferolateral distribution.  No reversibility.  Infarct with no ischemia. => Intermediate Risk because of old infarct and mildly reduced EF, but no evidence of ischemia.   TREADMILL STRESS ECHO  09/17/2017   Pre-stress normal LV function at rest but no inferior posterior akinesis..  Exercised for: 35 min.  7.4 METS --> fair functional capacity.  Inferior posterior wall akinesis at rest consistent with prior MI --> maintained inferior posterior akinesis with exertion.  No evidence of ischemia.   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     He lives with his current wife of 9 years.   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  [place] a mile radius based out  of [employer] [facility] [employer]   Financial Resource Strain: Low Risk  (07/25/2022)   Overall Financial Resource Strain (CARDIA)    Difficulty of Paying Living Expenses: Not very hard  Food Insecurity: No Food Insecurity (07/25/2022)   Hunger Vital Sign    Worried About Running Out of Food in the Last Year: Never true    Ran Out of Food in the Last Year: Never true  Transportation Needs: No Transportation Needs (07/25/2022)   PRAPARE - Hydrologist (Medical): No    Lack of Transportation (Non-Medical): No  Physical Activity: Insufficiently Active (07/25/2022)   Exercise Vital Sign    Days of Exercise per Week: 1 day    Minutes of Exercise per Session: 20 min  Stress: No Stress Concern Present (  07/25/2022)   [employer] [place]    Feeling of Stress : Not at all  Social Connections: [place] (07/25/2022)   Social Connection and Isolation Panel [NHANES]    Frequency of Communication with Friends and Family: Twice a week    Frequency of Social Gatherings with Friends and Family: Twice a week    Attends Religious Services: More than 4 times per year    Active Member of [employer] or Organizations: Yes    Attends Music therapist: More than 4 times per year    Marital Status: Married    Tobacco Counseling Counseling given: Not Answered Tobacco comments: currenty vapes (tabaco)   Clinical Intake:  Pre-visit preparation completed: Yes  Pain : 0-10 Pain Score: 3  Pain Location: Back Pain Orientation: Lower Pain Radiating Towards: lower extremity     BMI - recorded: 45.77 (03/18/2022) Nutritional Status: BMI > 30  Obese Nutritional Risks: None Diabetes: No  How often do you need to have someone help you when you read instructions, pamphlets, or other written materials from your doctor or pharmacy?: 1 - Never What is the last grade level you completed in school?:  HSG; 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 no  Interpreter Needed?: No  Information entered by :: [employer], LPN.   Activities of Daily Living    07/25/2022    4:32 PM 07/24/2022    5:56 PM  In your present state of health, do you have any difficulty performing the following activities:  Hearing? 1 1  Vision? 0 0  Difficulty concentrating or making decisions? 0 0  Walking or climbing stairs? 0 0  Dressing or bathing? 0 0  Doing errands, shopping? 0 0  Preparing Food and eating ? N N  Using the Toilet? N N  In the past six months, have you accidently leaked urine? N N  Do you have problems with loss of bowel control? N N  Managing your Medications? N N  Managing your Finances? N N  Housekeeping or managing your Housekeeping? N N    Patient Care Team: [name], MD as PCP - General (Family Medicine) Leonie Man, MD as PCP - Cardiology (Cardiology)  Indicate any recent Medical Services you may have received from other than [facility] providers in the past year (date may be approximate).     Assessment:   This is a routine wellness examination for Bruce Williams.  Hearing/Vision screen Hearing Screening - Comments:: Patient has difficulty hearing.  Has hearing aids but does not wear them. Vision Screening - Comments:: Wears rx glasses - up to date with routine eye exams with [facility] (whoever is in the network at the time)   Dietary issues and exercise activities discussed: Current Exercise Habits: The patient has a physically strenuous job, but has no regular exercise apart from work., Exercise limited by: None identified   Goals Addressed             This Visit's Progress    My goal is to stay alive and maintain my health.        Depression Screen    07/25/2022    4:17 PM 11/22/2021    9:00 AM 02/09/2021    2:01 PM 07/05/2020    8:14 AM 01/03/2020    9:45 AM 11/02/2019    8:13 AM 08/31/2018    1:26 PM  PHQ 2/9 Scores  PHQ - 2 Score 0 0 0 0  0  0 0    Fall Risk    07/25/2022    4:32 PM 07/24/2022    5:56 PM 11/22/2021    9:00 AM 10/22/2021    2:44 PM 07/20/2021    8:57 AM  Fall Risk   Falls in the past year? 0 0 0 0 0  Number falls in past yr: 0  0 0 0  Injury with Fall? 0  0 0 0  Risk for fall due to : No Fall Risks  No Fall Risks No Fall Risks No Fall Risks  Follow up Falls prevention discussed  Falls evaluation completed Falls evaluation completed Falls evaluation completed    [place]:  Any stairs in or around the home? Yes  If so, are there any without handrails? No  Home free of loose throw rugs in walkways, pet beds, electrical cords, etc? Yes  Adequate lighting in your home to reduce risk of falls? Yes   ASSISTIVE DEVICES UTILIZED TO PREVENT FALLS:  Life alert? No  Use of a cane, walker or w/c? No  Grab bars in the bathroom? No  Shower chair or bench in shower? Yes  Elevated toilet seat or a handicapped toilet? No   TIMED UP AND GO:  Was the test performed? No .  Length of time to ambulate 10 feet: n/a sec.   Appearance of gait: Gait not evaluated during this visit.  Cognitive Function:        07/25/2022    4:34 PM 02/09/2021    4:30 PM 01/03/2020    9:45 AM 11/02/2019    8:13 AM 08/31/2018    1:27 PM  6CIT Screen  What Year? 0 points 0 points 0 points 0 points 0 points  What month? 0 points 0 points 0 points 0 points 0 points  What time? 0 points 0 points 0 points 0 points 0 points  Count back from 20 0 points 0 points 0 points 0 points 0 points  Months in reverse 0 points 0 points 0 points 0 points 0 points  Repeat phrase 0 points 0 points 0 points 4 points 0 points  Total Score 0 points 0 points 0 points 4 points 0 points    Immunizations Immunization History  Administered Date(s) Administered   Influenza,inj,Quad PF,6+ Mos 09/26/2016   Influenza-Unspecified 09/13/2015    TDAP status: declined  Flu Vaccine status: Declined, Education has been  provided regarding the importance of this vaccine but patient still declined. Advised may receive this vaccine at local pharmacy or Health Dept. Aware to provide a copy of the vaccination record if obtained from local pharmacy or Health Dept. Verbalized acceptance and understanding.  Pneumococcal vaccine status: Declined,  Education has been provided regarding the importance of this vaccine but patient still declined. Advised may receive this vaccine at local pharmacy or Health Dept. Aware to provide a copy of the vaccination record if obtained from local pharmacy or Health Dept. Verbalized acceptance and understanding.   Covid-19 vaccine status: Declined, Education has been provided regarding the importance of this vaccine but patient still declined. Advised may receive this vaccine at local pharmacy or Health Dept.or vaccine clinic. Aware to provide a copy of the vaccination record if obtained from local pharmacy or Health Dept. Verbalized acceptance and understanding.  Qualifies for Shingles Vaccine? Yes   Zostavax completed No   Shingrix Completed?: No.    Education has been provided regarding the importance of this vaccine. Patient has been advised to  call insurance company to determine out of pocket expense if they have not yet received this vaccine. Advised may also receive vaccine at local pharmacy or Health Dept. Verbalized acceptance and understanding.  Screening Tests Health Maintenance  Topic Date Due   COVID-19 Vaccine (1) Never done   Hepatitis C Screening  Never done   TETANUS/TDAP  Never done   Zoster Vaccines- Shingrix (1 of 2) Never done   Pneumonia Vaccine 84+ Years old (1 - PCV) Never done   INFLUENZA VACCINE  06/25/2022   COLONOSCOPY (Pts 45-41yr Insurance coverage will need to be confirmed)  04/26/2025   HPV VACCINES  Aged Out    Health Maintenance  Health Maintenance Due  Topic Date Due   COVID-19 Vaccine (1) Never done   Hepatitis C Screening  Never done    TETANUS/TDAP  Never done   Zoster Vaccines- Shingrix (1 of 2) Never done   Pneumonia Vaccine 69 Years old (1 - PCV) Never done   INFLUENZA VACCINE  06/25/2022    Colorectal cancer screening: Type of screening: Colonoscopy. Completed 04/27/2015. Repeat every 5 years (Patient stated that he will call to schedule)  Lung Cancer Screening: (Low Dose CT Chest recommended if Age 69-80years, 30 pack-year currently smoking OR have quit w/in 15years.) does not qualify.   Lung Cancer Screening Referral: no  Additional Screening:  Hepatitis C Screening: does qualify; Completed no  Vision Screening: Recommended annual ophthalmology exams for early detection of glaucoma and other disorders of the eye. Is the patient up to date with their annual eye exam?  Yes  Who is the provider or what is the name of the office in which the patient attends annual eye exams? [facility] If pt is not established with a provider, would they like to be referred to a provider to establish care? No .   Dental Screening: Recommended annual dental exams for proper oral hygiene  Community Resource Referral / Chronic Care Management: CRR required this visit?  No   CCM required this visit?  No      Plan:     I have personally reviewed and noted the following in the patient's chart:   Medical and social history Use of alcohol, tobacco or illicit drugs  Current medications and supplements including opioid prescriptions. Patient is not currently taking opioid prescriptions. Functional ability and status Nutritional status Physical activity Advanced directives List of other physicians Hospitalizations, surgeries, and ER visits in previous 12 months Vitals Screenings to include cognitive, depression, and falls Referrals and appointments  In addition, I have reviewed and discussed with patient certain preventive protocols, quality metrics, and best practice recommendations. A written personalized care plan for  preventive services as well as general preventive health recommendations were provided to patient.     SSheral Flow LPN   [id number]  Nurse Notes:  Patient is cogitatively intact. There were no vitals filed for this visit.

## 2022-07-25 NOTE — Patient Instructions (Signed)
Bruce Williams , Thank you for taking time to come for your Medicare Wellness Visit. I appreciate your ongoing commitment to your health goals. Please review the following plan we discussed and let me know if I can assist you in the future.   Screening recommendations/referrals: Colonoscopy: last done 04/27/2015; due every 5 years Recommended yearly ophthalmology/optometry visit for glaucoma screening and checkup Recommended yearly dental visit for hygiene and checkup  Vaccinations: declines all Influenza vaccine: recommend every Fall Pneumococcal vaccine: recommend once per lifetime Prevnar-20 Tdap vaccine: recommend every 10 years Shingles vaccine: recommend Shingrix which is 2 doses 2-6 months apart and over 90% effective     Covid-19: recommend 2 doses one month apart with a booster 6 months later  Advanced directives: No; Advance directive discussed with you today. Even though you declined this today please call our office should you change your mind and we can give you the proper paperwork for you to fill out.  Conditions/risks identified: Yes; To stay alive and maintain my health.  Next appointment: Follow up in one year for your annual wellness visit.   Preventive Care 30 Years and Older, Male  Preventive care refers to lifestyle choices and visits with your health care provider that can promote health and wellness. What does preventive care include? A yearly physical exam. This is also called an annual well check. Dental exams once or twice a year. Routine eye exams. Ask your health care provider how often you should have your eyes checked. Personal lifestyle choices, including: Daily care of your teeth and gums. Regular physical activity. Eating a healthy diet. Avoiding tobacco and drug use. Limiting alcohol use. Practicing safe sex. Taking low doses of aspirin every day. Taking vitamin and mineral supplements as recommended by your health care provider. What happens during  an annual well check? The services and screenings done by your health care provider during your annual well check will depend on your age, overall health, lifestyle risk factors, and family history of disease. Counseling  Your health care provider may ask you questions about your: Alcohol use. Tobacco use. Drug use. Emotional well-being. Home and relationship well-being. Sexual activity. Eating habits. History of falls. Memory and ability to understand (cognition). Work and work Statistician. Screening  You may have the following tests or measurements: Height, weight, and BMI. Blood pressure. Lipid and cholesterol levels. These may be checked every 5 years, or more frequently if you are over 39 years old. Skin check. Lung cancer screening. You may have this screening every year starting at age 61 if you have a 30-pack-year history of smoking and currently smoke or have quit within the past 15 years. Fecal occult blood test (FOBT) of the stool. You may have this test every year starting at age 26. Flexible sigmoidoscopy or colonoscopy. You may have a sigmoidoscopy every 5 years or a colonoscopy every 10 years starting at age 66. Prostate cancer screening. Recommendations will vary depending on your family history and other risks. Hepatitis C blood test. Hepatitis B blood test. Sexually transmitted disease (STD) testing. Diabetes screening. This is done by checking your blood sugar (glucose) after you have not eaten for a while (fasting). You may have this done every 1-3 years. Abdominal aortic aneurysm (AAA) screening. You may need this if you are a current or former smoker. Osteoporosis. You may be screened starting at age 31 if you are at high risk. Talk with your health care provider about your test results, treatment options, and if necessary, the  need for more tests. Vaccines  Your health care provider may recommend certain vaccines, such as: Influenza vaccine. This is recommended  every year. Tetanus, diphtheria, and acellular pertussis (Tdap, Td) vaccine. You may need a Td booster every 10 years. Zoster vaccine. You may need this after age 22. Pneumococcal 13-valent conjugate (PCV13) vaccine. One dose is recommended after age 31. Pneumococcal polysaccharide (PPSV23) vaccine. One dose is recommended after age 51. Talk to your health care provider about which screenings and vaccines you need and how often you need them. This information is not intended to replace advice given to you by your health care provider. Make sure you discuss any questions you have with your health care provider. Document Released: 12/08/2015 Document Revised: 07/31/2016 Document Reviewed: 09/12/2015 Elsevier Interactive Patient Education  2017 [place] Prevention in the Home Falls can cause injuries. They can happen to people of all ages. There are many things you can do to make your home safe and to help prevent falls. What can I do on the outside of my home? Regularly fix the edges of walkways and driveways and fix any cracks. Remove anything that might make you trip as you walk through a door, such as a raised step or threshold. Trim any bushes or trees on the path to your home. Use bright outdoor lighting. Clear any walking paths of anything that might make someone trip, such as rocks or tools. Regularly check to see if handrails are loose or broken. Make sure that both sides of any steps have handrails. Any raised decks and porches should have guardrails on the edges. Have any leaves, snow, or ice cleared regularly. Use sand or salt on walking paths during winter. Clean up any spills in your garage right away. This includes oil or grease spills. What can I do in the bathroom? Use night lights. Install grab bars by the toilet and in the tub and shower. Do not use towel bars as grab bars. Use non-skid mats or decals in the tub or shower. If you need to sit down in the shower,  use a plastic, non-slip stool. Keep the floor dry. Clean up any water that spills on the floor as soon as it happens. Remove soap buildup in the tub or shower regularly. Attach bath mats securely with double-sided non-slip rug tape. Do not have throw rugs and other things on the floor that can make you trip. What can I do in the bedroom? Use night lights. Make sure that you have a light by your bed that is easy to reach. Do not use any sheets or blankets that are too big for your bed. They should not hang down onto the floor. Have a firm chair that has side arms. You can use this for support while you get dressed. Do not have throw rugs and other things on the floor that can make you trip. What can I do in the kitchen? Clean up any spills right away. Avoid walking on wet floors. Keep items that you use a lot in easy-to-reach places. If you need to reach something above you, use a strong step stool that has a grab bar. Keep electrical cords out of the way. Do not use floor polish or wax that makes floors slippery. If you must use wax, use non-skid floor wax. Do not have throw rugs and other things on the floor that can make you trip. What can I do with my stairs? Do not leave any items on the  stairs. Make sure that there are handrails on both sides of the stairs and use them. Fix handrails that are broken or loose. Make sure that handrails are as long as the stairways. Check any carpeting to make sure that it is firmly attached to the stairs. Fix any carpet that is loose or worn. Avoid having throw rugs at the top or bottom of the stairs. If you do have throw rugs, attach them to the floor with carpet tape. Make sure that you have a light switch at the top of the stairs and the bottom of the stairs. If you do not have them, ask someone to add them for you. What else can I do to help prevent falls? Wear shoes that: Do not have high heels. Have rubber bottoms. Are comfortable and fit you  well. Are closed at the toe. Do not wear sandals. If you use a stepladder: Make sure that it is fully opened. Do not climb a closed stepladder. Make sure that both sides of the stepladder are locked into place. Ask someone to hold it for you, if possible. Clearly mark and make sure that you can see: Any grab bars or handrails. First and last steps. Where the edge of each step is. Use tools that help you move around (mobility aids) if they are needed. These include: Canes. Walkers. Scooters. Crutches. Turn on the lights when you go into a dark area. Replace any light bulbs as soon as they burn out. Set up your furniture so you have a clear path. Avoid moving your furniture around. If any of your floors are uneven, fix them. If there are any pets around you, be aware of where they are. Review your medicines with your doctor. Some medicines can make you feel dizzy. This can increase your chance of falling. Ask your doctor what other things that you can do to help prevent falls. This information is not intended to replace advice given to you by your health care provider. Make sure you discuss any questions you have with your health care provider. Document Released: 09/07/2009 Document Revised: 04/18/2016 Document Reviewed: 12/16/2014 Elsevier Interactive Patient Education  2017 [employer].

## 2022-08-06 NOTE — Patient Instructions (Incomplete)
Please continue using your CPAP regularly. While your insurance requires that you use CPAP at least 4 hours each night on 70% of the nights, I recommend, that you not skip any nights and use it throughout the night if you can. Getting used to CPAP and staying with the treatment long term does take time and patience and discipline. Untreated obstructive sleep apnea when it is moderate to severe can have an adverse impact on cardiovascular health and raise her risk for heart disease, arrhythmias, hypertension, congestive heart failure, stroke and diabetes. Untreated obstructive sleep apnea causes sleep disruption, nonrestorative sleep, and sleep deprivation. This can have an impact on your day to day functioning and cause daytime sleepiness and impairment of cognitive function, memory loss, mood disturbance, and problems focussing. Using CPAP regularly can improve these symptoms.   Continue gabapentin '100mg'$  up to three times daily. You can take '300mg'$  at bedtime if you prefer.   Follow up in 1 year

## 2022-08-06 NOTE — Progress Notes (Unsigned)
PATIENT: Bruce Williams DOB: 11-May-1951  REASON FOR VISIT: follow up HISTORY FROM: patient  No chief complaint on file.    HISTORY OF PRESENT ILLNESS:  08/06/22 ALL:  Bruce Williams returns for follow up for OSA on CPAP and recently diagnosed peripheral neuropathy.   He was started on gabapentin '100mg'$  up to three times daily. He felt ALA was too expensive.   01/31/2022 CD: Bruce Williams is a 71 y.o. male patient, seen here in a referral from Dr. Carlota Raspberry for referral for foot numbness and lower back pain radiating down R leg.  Radiating outside of the leg into the toes. Across dorsum pedis between hallux and second toe.    Requesting nerve conduction testing to determine neuropathy cause. Neck pain and burning at base of neck. No recent injuries.    He is not a drinker, vapes , former smoker.    TSH, Vit B 12 , lipids, electrolytes and CMET were normal 09-2021. He reports arthritis pain, takes ibuprofen.   08/06/2021 ALL: Bruce Williams is a 71 y.o. male here today for follow up for OSA on CPAP.  He received new CPAP 05/2021. He is doing well. He reports nightly use. He does note air leak when sleeping on his side. He has tried multiple mask and most comfortable with medium nasal pillow. He is frustrated with DME as he report last three months of data is not available to him on [facility]. We are able to pull most recent data. 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      HISTORY: (copied from Dr Dohmeier's previous note)  HPI:  Bruce Williams is a 71 y.o. male patient, seen here in a referral from Dr. Carlota Raspberry for a sleep apnea evaluation-  I have the pleasure of meeting Mr. Bruce Williams last time in September 2019 at this time he already was a CPAP user and old the current machine since 2016.  He has been 100% compliant CPAP user 6 hours 41 minutes on average his minimum pressure setting is 5 maximum pressure setting 17 cmH2O with a 3 cm expiratory pressure relief.  His 95th percentile  pressure is 14.3 cm so he is well within his current settings.  The residual AHI is 2.2 and there are no Cheyne-Stokes respirations measured.  A CPAP compliance report is 100% compliant CPAP compliance reports do not show the current baseline of apnea.  Hypoxemia.  His BMI has been 45 and has been in that range for a long time.  So I like for him to write a new prescription for his supplies, I also would order at least a home sleep test to have a new baseline and based on his current apnea count I would set the new machine probably similar to the old one.  The patient is okay with this approach.    08-04-2018 He has been a DOT driver -  [facility] now [facility] patient , with known OSA and CPAP treatment.  Mr. 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 He was first diagnosed in 2002 at [facility] [place] in hospital sleep lab.  He learned that he had sleep apnea and was treated with CPAP ever since.  Once he moved to [place] he continued treatment here at [facility].  He has been using an AutoSet air sense 10 machine, and this was just issued to him in May 2016.  It is set  between 5 and 15 cmH2O with 3 cm EPR, he is using on average 6 hours and 35 minutes of CPAP therapy nightly, 100% compliance by download and his residual AHI is 1.8.  He does however have high air leakage, the 95th percentile pressure is close to 14 cm which would mean that he could use a little bit higher pressure settings on his AutoSet.  He feels clinically that his CPAP therapy is sufficient he has not endorsed elevated sleepiness levels on the Epworth sleepiness score.  Today he endorses at 4 points, fatigue severity at 23 points, and the geriatric depression score was endorsed at 1 out of 15 points.   DOT related 90-day download shows equally 100% of use at time 99% by time over 4 hours, only one night to use the machine for  3-1/2 hours.  Average use of time 6 hours 26 minutes, same settings as above, residual AHI 2.0 with obstructive apneas being 0.5.  Similar air leak.   Chief complaint according to patient :He needs to transfer care and wanted to be in the [facility].  Mr. Bruce Williams has been a compliant CPAP user for well over 15 years, and he has been followed at [facility] for the last 3 or 5 years.  That needs to be no change in settings, his current machine is 23.69 years old but I like for him to have a new nasal pillow fitted.  The ResMed air fit P 10 is very easily sliding off and he may do better with an N 30 I. I will follow him yearly until he needs a new machine.    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  He works about 10 to 12 hours daily in his retirement job.  He just transitioned to Medicare.  Occasional he will spend the evening at church functions, the couple goes bowling, movies goes to the movies but he is not necessarily physically active. His bedtime is usually between 10 and 11 PM unless he has a very early morning assignment.  He usually has no trouble to initiate sleep, on occasion he will be early in bed because of the early morning assignments, but he usually sleeps well in a cool, quiet and dark bedroom.  He does not have nocturia.  He sleeps on either side, mostly mostly the left. He sleeps on only one pillow for head support in a nonadjustable bed.  He rises in the morning usually at 6 AM or 7 AM, and he usually feels rested at the time.  He averages 7 to 8 hours of nocturnal sleep, most nights uninterrupted.   I have the pleasure of reviewing Mr. Bruce Williams's last sleep study which was a home sleep test performed at [facility] on [address] in [place] on Mar 30, 2015 by Dr. at bedtime Mohammed.  The AHI was 57.6, supine sleep position  accentuated the AHI to 59.3, the majority was obstructive 28.5/h followed by centrals at 14.4/h.  Oxygen saturation low was 79%, duration of 31 minutes at or below 88%.  1 hour 49 minutes.  Pulse rate between 47 and 94 bpm apparently no arrhythmia was captured.  He was issued the auto set air sense 10 machine after this test.  I also have a pulmonology note available obstructive sleep apnea ordering AutoPap 5 through 15 cmH2O, risk factor BMI 45-49.9  in adult.  Based on these notes I should be able to transfer his CPAP care and supplies to a local DME.   Sleep medical history and family sleep history: CAD, MI in 1998 and stent placement in [place]. - reports ongoing  irregular heart beats.  OSA affected his father, another air [employer] veteran. No sleep walking, enuresis , no night terrors.    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 The patient is married, has 3 adult children ( oldest 3 year old daughter with 3 children, 2 step children) , 5 grandchildren- former 35 year smoker- quit 5 years ago in 2014. Beer / wine - one glass a week. Caffeine ; 2 cups of coffee a day- no sodas, not often iced tea.    REVIEW OF SYSTEMS: Out of a complete 14 system review of symptoms, the patient complains only of the following symptoms, none and all other reviewed systems are negative.  ESS: 4  ALLERGIES: No Known Allergies  HOME MEDICATIONS: Outpatient Medications Prior to Visit  Medication Sig Dispense Refill   aspirin EC 81 MG tablet Take 81 mg by mouth daily.     bisoprolol (ZEBETA) 5 MG tablet Take 1 tablet by mouth once daily 90 tablet 0   co-enzyme Q-10 30 MG capsule Take 30 mg by mouth 3 (three) times daily.     ezetimibe (ZETIA) 10 MG tablet Take 1 tablet (10 mg total) by mouth daily. 90 tablet 3   gabapentin (NEURONTIN) 100 MG capsule Take 1 capsule (100 mg total) by mouth 3 (three) times daily as needed. 90 capsule 5   ketoconazole (NIZORAL) 200 MG tablet Take 2 tablets (400 mg  total) by mouth daily. 2 tablet 1   Multiple Vitamin (MULTI-VITAMINS) TABS Take by mouth.     Probiotic Product (PROBIOTIC MATURE ADULT PO) Take 94 mg by mouth.     rosuvastatin (CRESTOR) 20 MG tablet Take 1 tablet by mouth once daily 90 tablet 0   triamcinolone cream (KENALOG) 0.1 % Apply to patches of dry skin 2 times daily as needed     TURMERIC PO Take by mouth.     No facility-administered medications prior to visit.    PAST MEDICAL HISTORY: Past Medical History:  Diagnosis Date   Coronary artery disease involving native heart without angina pectoris 1998   No angina since MI in [place] Echo 08/2017   History of acute Inferior-Posterior wall MI 1998   ([place]) - BMS PCI RCA (inferoposterior Akinesis on Echo) & Large Sized - Severe Intesity Fixed Perfusion Defect in Basal-mid-apical Inferior-inferolateral & apical lateral wall c/w prior Infarct (no peri-infarct ischemia)   Hyperlipidemia with target low density lipoprotein (LDL) cholesterol less than 70 mg/dL    OSA on CPAP    Sleep apnea    Phreesia 02/06/2021    PAST SURGICAL HISTORY: Past Surgical History:  Procedure Laterality Date   ANKLE FRACTURE SURGERY Right 1980's   CORONARY/GRAFT ACUTE MI REVASCULARIZATION  1990   BMS PCI to RCA (in [place]) in setting of inferoposterior STEMI   EXERCISE TOLERANCE TEST  08/01/2020   Exercised 3:36 minutes-  Peak HR 137 bpm, peak BP 225/77 mmHg.  MPHR 153 bpm-achieved 89%.  5.3 METS.  Nondiagnostic EKG stress test for ischemia due to resting ST or T wave abnormalities.  Poor functional capacity, with hypertensive response to exercise.  Read As Intermediate Risk   NM MYOVIEW LTD  08/30/2020   EF 45 to 50% with  mild diffuse HK.  Large size/severe defect in the basal-mid-apical inferior-inferolateral and apical lateral wall.  Consistent with prior MI in the inferolateral distribution.  No reversibility.  Infarct with no ischemia. => Intermediate Risk because of old infarct  and mildly reduced EF, but no evidence of ischemia.   TREADMILL STRESS ECHO  09/17/2017   Pre-stress normal LV function at rest but no inferior posterior akinesis..  Exercised for: 35 min.  7.4 METS --> fair functional capacity.  Inferior posterior wall akinesis at rest consistent with prior MI --> maintained inferior posterior akinesis with exertion.  No evidence of ischemia.    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     He lives with his current wife of 9 years.   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 [employer]   Financial Resource Strain: Low Risk  (07/25/2022)   Overall Financial Resource Strain (CARDIA)    Difficulty of Paying Living Expenses: Not very hard  Food Insecurity: No Food Insecurity (07/25/2022)   Hunger Vital Sign    Worried About Running Out of Food in the Last Year: Never true    Ran Out of Food in the Last Year: Never true  Transportation Needs: No Transportation Needs (07/25/2022)   PRAPARE -  Hydrologist (Medical): No    Lack of Transportation (Non-Medical): No  Physical Activity: Insufficiently Active (07/25/2022)   Exercise Vital Sign    Days of Exercise per Week: 1 day    Minutes of Exercise per Session: 20 min  Stress: No Stress Concern Present (07/25/2022)   [place]    Feeling of Stress : Not at all  Social Connections: [place] (07/25/2022)   Social Connection and Isolation Panel [NHANES]    Frequency of Communication with Friends and Family: Twice a week    Frequency of Social Gatherings with Friends and Family: Twice a week    Attends Religious Services: More than 4 times per year    Active Member of [employer] or Organizations: Yes    Attends Archivist Meetings: More than 4 times per year    Marital Status: Married  Human resources officer Violence: Not At Risk (07/25/2022)   Humiliation, Afraid, Rape, and Kick questionnaire  Fear of Current or Ex-Partner: No    Emotionally Abused: No    Physically Abused: No    Sexually Abused: No     PHYSICAL EXAM  There were no vitals filed for this visit.  There is no height or weight on file to calculate BMI.  Generalized: Well developed, in no acute distress  Cardiology: normal rate and rhythm, no murmur noted Respiratory: clear to auscultation bilaterally  Neurological examination  Mentation: Alert oriented to time, place, history taking. Follows all commands speech and language fluent Cranial nerve II-XII: Pupils were equal round reactive to light. Extraocular movements were full, visual field were full  Motor: The motor testing reveals 5 over 5 strength of all 4 extremities. Good symmetric motor tone is noted throughout.  Gait and station: Gait is normal.    DIAGNOSTIC DATA (LABS, IMAGING, TESTING) - I reviewed patient records, labs, notes, testing and imaging myself where available.      No data to  display           No results found for: "WBC", "HGB", "HCT", "MCV", "PLT"    Component Value Date/Time   NA 139 10/22/2021 1544   NA 140 03/08/2021 1006   K 4.4 10/22/2021 1544   CL 105 10/22/2021 1544   CO2 24 10/22/2021 1544   GLUCOSE 86 10/22/2021 1544   BUN 18 10/22/2021 1544   BUN 19 03/08/2021 1006   CREATININE 1.01 10/22/2021 1544   CALCIUM 9.6 10/22/2021 1544   PROT 7.2 10/22/2021 1544   PROT 7.0 03/08/2021 1006   ALBUMIN 4.2 10/22/2021 1544   ALBUMIN 4.2 03/08/2021 1006   AST 16 10/22/2021 1544   ALT 15 10/22/2021 1544   ALKPHOS 40 10/22/2021 1544   BILITOT 0.4 10/22/2021 1544   BILITOT 0.4 03/08/2021 1006   GFRNONAA 85 06/28/2020 0808   GFRAA 98 06/28/2020 0808   Lab Results  Component Value Date   CHOL 153 10/22/2021   HDL 56.70 10/22/2021   LDLCALC 78 10/22/2021   TRIG 89.0 10/22/2021   CHOLHDL 3 10/22/2021   No results found for: "HGBA1C" Lab Results  Component Value Date   VITAMINB12 421 10/22/2021   Lab Results  Component Value Date   TSH 1.86 10/22/2021     ASSESSMENT AND PLAN 71 y.o. year old male  has a past medical history of Coronary artery disease involving native heart without angina pectoris (1998), History of acute Inferior-Posterior wall MI (1998), Hyperlipidemia with target low density lipoprotein (LDL) cholesterol less than 70 mg/dL, OSA on CPAP, and Sleep apnea. here with   No diagnosis found.    Nixon Kolton is doing well on CPAP therapy. Compliance report reveals excellent compliance. He was encouraged to continue using CPAP nightly and for greater than 4 hours each night. He will continue to monitor for elevated leak at home. AHI is well managed. We will update supply orders as indicated. Risks of untreated sleep apnea review and education materials provided. Healthy lifestyle habits encouraged. He will follow up in 1 year, sooner if needed. He verbalizes understanding and agreement with this plan.   Last CPAP set up date  05/2021.    No orders of the defined types were placed in this encounter.     No orders of the defined types were placed in this encounter.    Debbora Presto, FNP-C 08/06/2022, 8:16 AM [facility] [address] [place], [place] [address] [phone]

## 2022-08-07 ENCOUNTER — Ambulatory Visit (INDEPENDENT_AMBULATORY_CARE_PROVIDER_SITE_OTHER): Payer: Medicare Other | Admitting: Family Medicine

## 2022-08-07 ENCOUNTER — Encounter: Payer: Self-pay | Admitting: Family Medicine

## 2022-08-07 VITALS — BP 146/79 | HR 72 | Ht 71.0 in | Wt 336.2 lb

## 2022-08-07 DIAGNOSIS — G4733 Obstructive sleep apnea (adult) (pediatric): Secondary | ICD-10-CM | POA: Diagnosis not present

## 2022-08-07 DIAGNOSIS — Z9989 Dependence on other enabling machines and devices: Secondary | ICD-10-CM | POA: Diagnosis not present

## 2022-08-07 DIAGNOSIS — M792 Neuralgia and neuritis, unspecified: Secondary | ICD-10-CM | POA: Diagnosis not present

## 2022-08-07 MED ORDER — GABAPENTIN 100 MG PO CAPS: 100.0000 mg | ORAL_CAPSULE | Freq: Three times a day (TID) | ORAL | 3 refills | Status: DC | PRN

## 2022-08-11 IMAGING — DX DG LUMBAR SPINE COMPLETE 4+V
5 series · 5 of 5 positions shown · non-contrast
Comparison: None.

CLINICAL DATA: Episodic low back pain. Right-sided radicular
symptoms extending to the right thigh. Right foot numbness.

EXAM:
LUMBAR SPINE - COMPLETE 4+ VIEW

[l-spine ap]
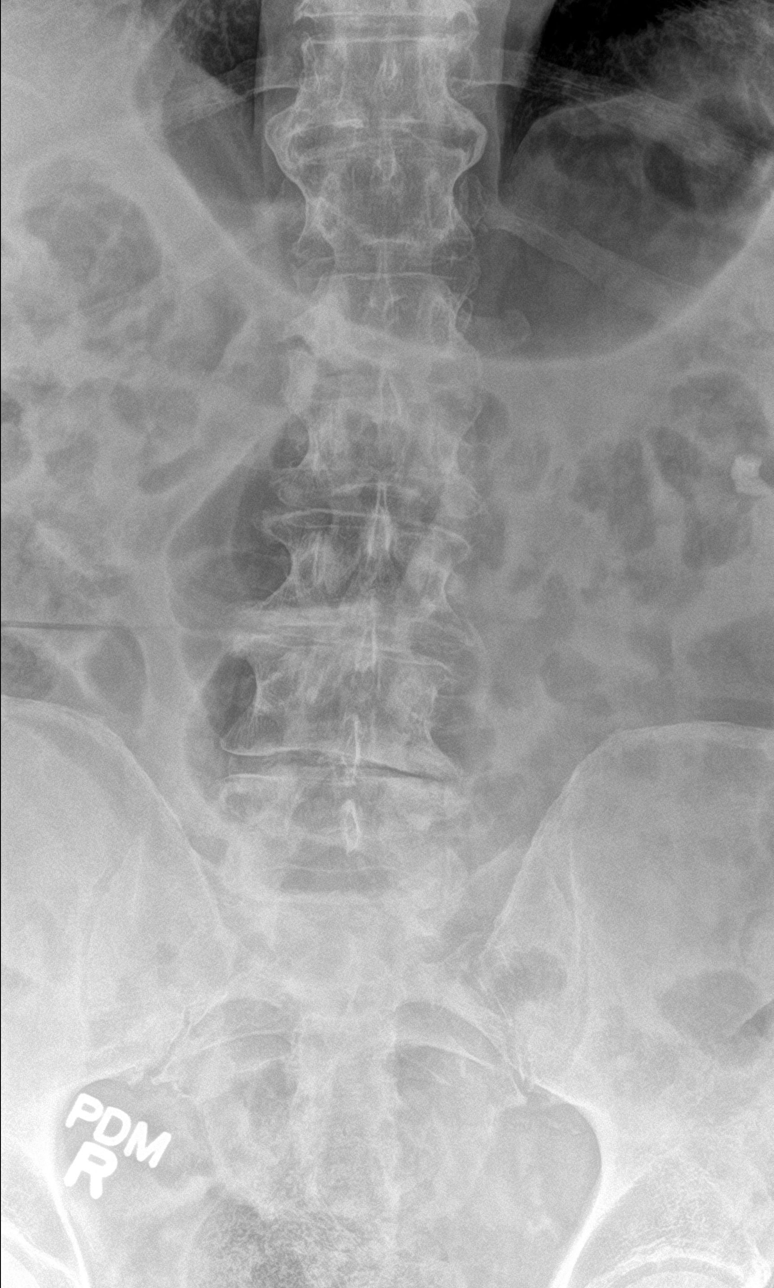

[l-spine obl (1 of 2)]
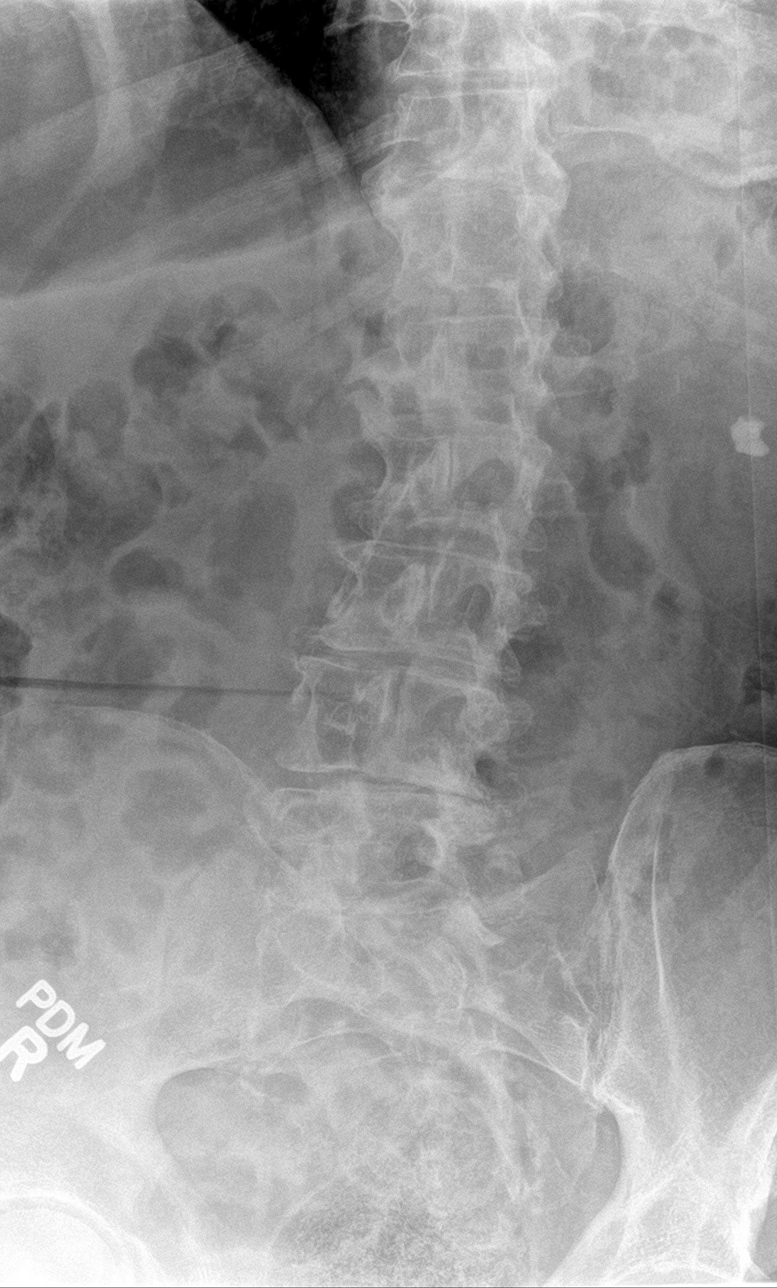

[l-spine obl (2 of 2)]
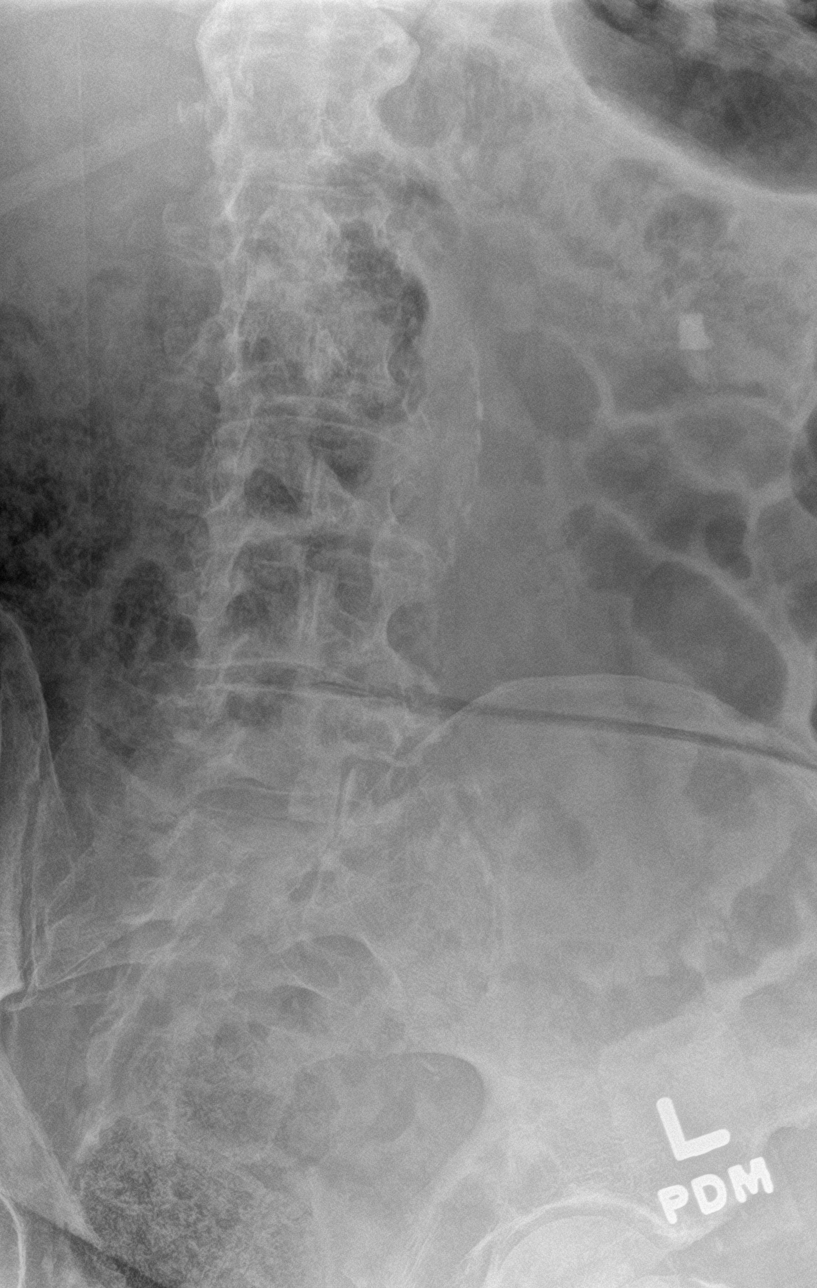

[l-spine lat]
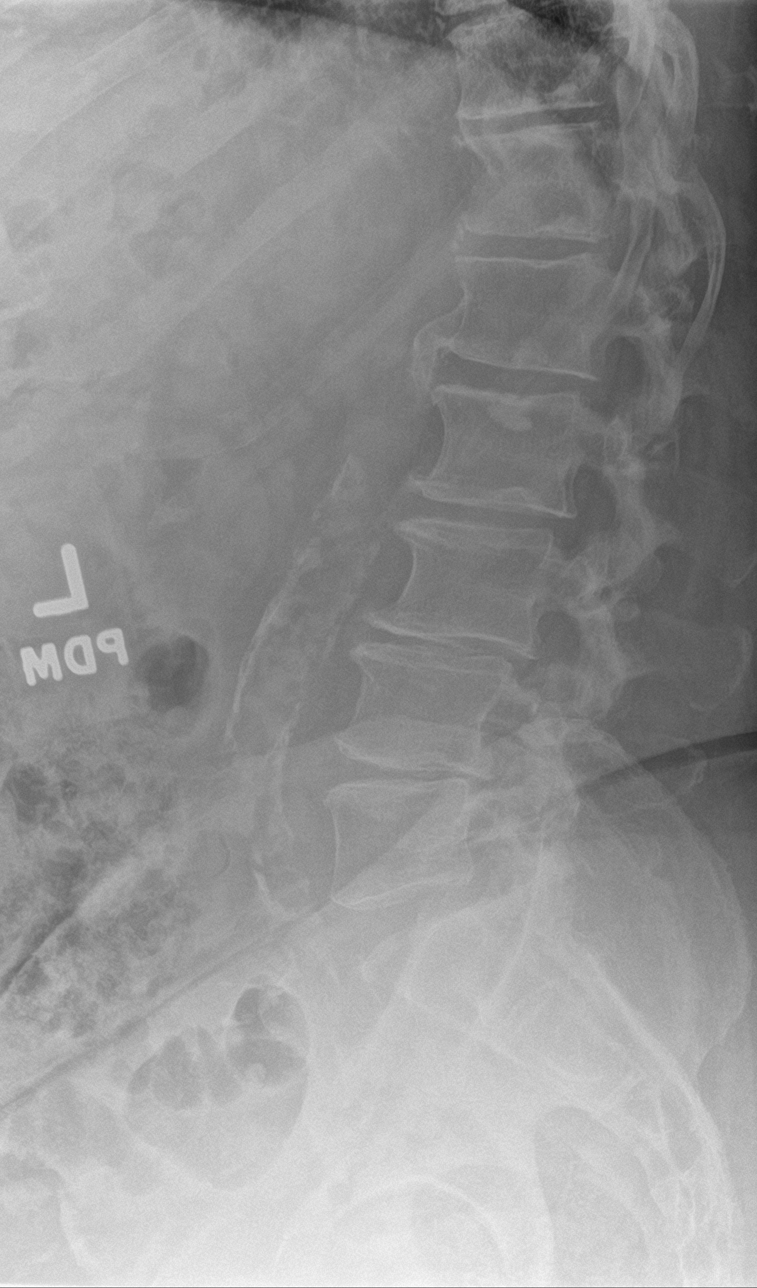

[l-spine spot]
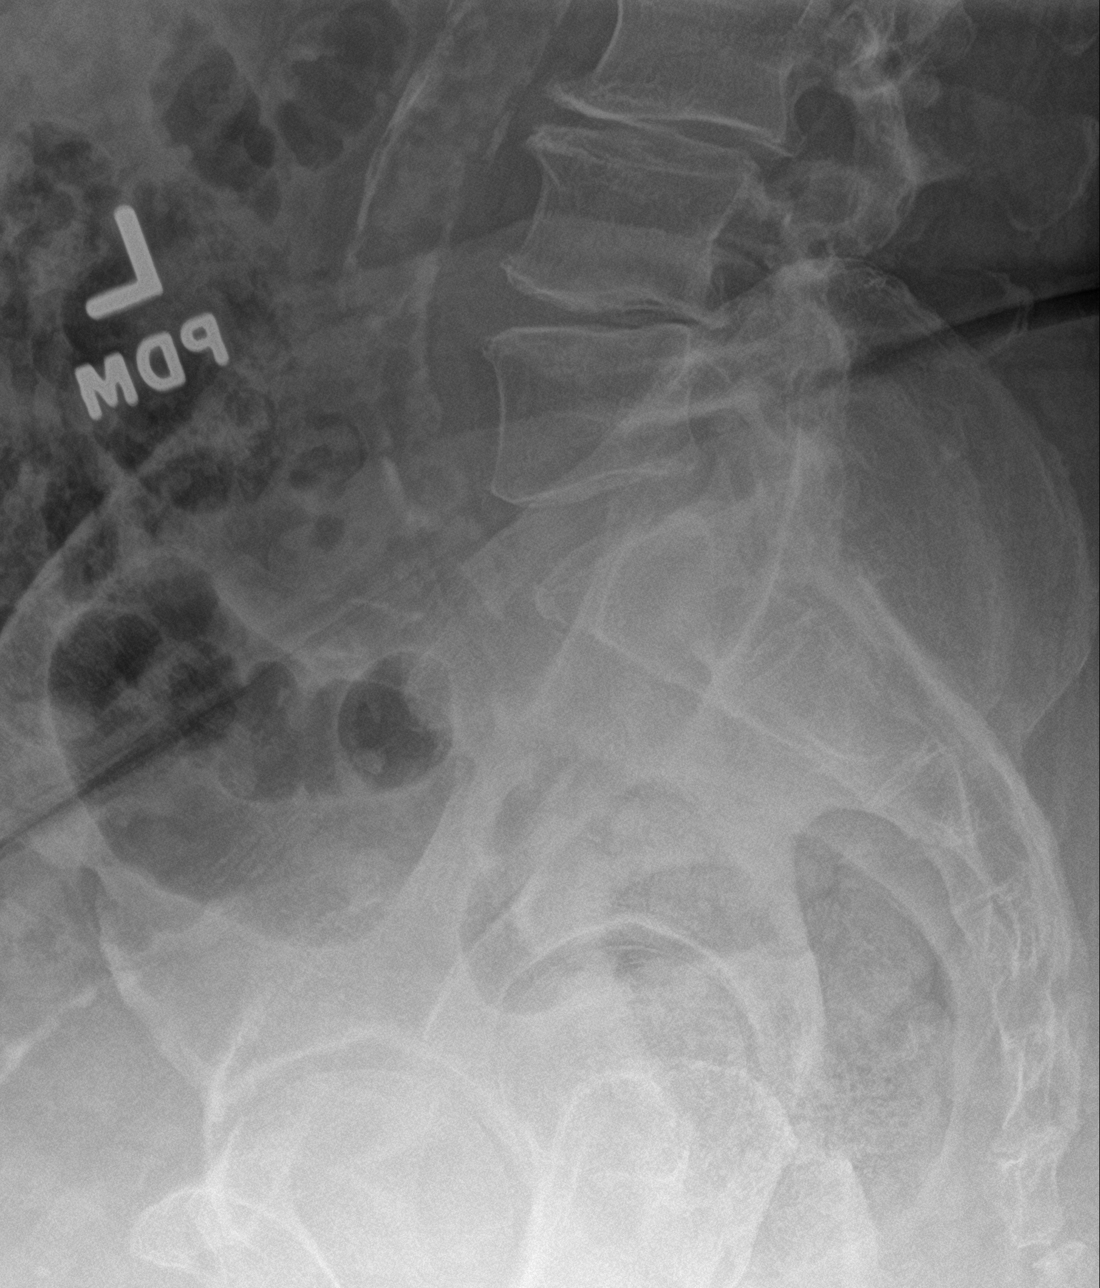

[5 of 5 positions shown; findings below may reference images not displayed]

FINDINGS: Grade 1 degenerative retrolisthesis is present at L2-3, L3-4, and
L4-5. Asymmetric left-sided disease is present at L4-5. Asymmetric
right-sided disease is present L3-4 and L5-S1 with facet disease
greatest at L5-S1 on the right.

Vertebral body heights are maintained. Atherosclerotic changes are
present in the aorta and branch vessels without aneurysm.
IMPRESSION: 1. Grade 1 degenerative retrolisthesis at L2-3, L3-4, and L4-5.
2. Asymmetric left-sided disease at L4-5 and right-sided disease at
L3-4 and L5-S1.

## 2022-10-15 ENCOUNTER — Other Ambulatory Visit: Payer: Self-pay | Admitting: Family Medicine

## 2022-10-15 DIAGNOSIS — E785 Hyperlipidemia, unspecified: Secondary | ICD-10-CM

## 2022-10-15 DIAGNOSIS — I2510 Atherosclerotic heart disease of native coronary artery without angina pectoris: Secondary | ICD-10-CM

## 2022-12-02 ENCOUNTER — Encounter: Payer: Self-pay | Admitting: Family Medicine

## 2022-12-02 ENCOUNTER — Ambulatory Visit (INDEPENDENT_AMBULATORY_CARE_PROVIDER_SITE_OTHER): Payer: Medicare Other | Admitting: Family Medicine

## 2022-12-02 VITALS — BP 118/72 | HR 63 | Temp 98.1°F | Wt 336.2 lb

## 2022-12-02 DIAGNOSIS — E785 Hyperlipidemia, unspecified: Secondary | ICD-10-CM | POA: Diagnosis not present

## 2022-12-02 DIAGNOSIS — M25512 Pain in left shoulder: Secondary | ICD-10-CM

## 2022-12-02 DIAGNOSIS — M25511 Pain in right shoulder: Secondary | ICD-10-CM

## 2022-12-02 NOTE — Progress Notes (Signed)
Subjective:  Patient ID: Clayvon Parlett, male    DOB: [birth year]  Age: 72 y.o. MRN: [id number]  CC:  Chief Complaint  Patient presents with   Hyperlipidemia    HPI Oluwatobi Visser presents for   Hyperlipidemia: With obesity Taking crestor '20mg'$  qd and Zetia '10mg'$  qd- started by cardiology for improved LDL with CAD. Cardiology Dr. Ellyn Hack.  Fasting for labs today.  Some left and right shoulder pain for past 6 months. Started after taking Zetia.  No known injury. Thought to be arthritis. Taking ibuprofen '400mg'$  BID.  Some relief with voltaren gel past 2 weeks.  Still trying to watch portion sizes.  Wt Readings from Last 3 Encounters:  12/02/22 (!) 336 lb 3.2 oz (152.5 kg)  08/07/22 (!) 336 lb 3.2 oz (152.5 kg)  06/05/22 (!) 328 lb (148.8 kg)     Lab Results  Component Value Date   CHOL 153 10/22/2021   HDL 56.70 10/22/2021   LDLCALC 78 10/22/2021   TRIG 89.0 10/22/2021   CHOLHDL 3 10/22/2021   Lab Results  Component Value Date   ALT 15 10/22/2021   AST 16 10/22/2021   ALKPHOS 40 10/22/2021   BILITOT 0.4 10/22/2021   Bisoprolol for irregular heart rate.  Home readings: none.  BP Readings from Last 3 Encounters:  12/02/22 118/72  08/07/22 (!) 146/79  03/18/22 124/80   Lab Results  Component Value Date   CREATININE 1.01 10/22/2021      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/16/2021   OSA on CPAP 03/08/2021   Abnormal stress electrocardiogram test using treadmill 08/07/2020   Essential hypertension 08/31/2018   Irregular heart beats 08/31/2018   Encounter for physical examination related to employment 08/31/2018   Coronary artery disease involving native heart without angina pectoris    Hyperlipidemia with target LDL less than 70    Past Medical History:  Diagnosis Date   Coronary artery disease involving native heart without angina pectoris 1998   No angina  since MI in 1998 - Non-ischemic Stress Echo 08/2017   History of acute Inferior-Posterior wall MI 1998   [place] - BMS PCI RCA (inferoposterior Akinesis on Echo) & Large Sized - Severe Intesity Fixed Perfusion Defect in Basal-mid-apical Inferior-inferolateral & apical lateral wall c/w prior Infarct (no peri-infarct ischemia)   Hyperlipidemia with target low density lipoprotein (LDL) cholesterol less than 70 mg/dL    OSA on CPAP    Sleep apnea    Phreesia 02/06/2021   Past Surgical History:  Procedure Laterality Date   ANKLE FRACTURE SURGERY Right 1980's   CORONARY/GRAFT ACUTE MI REVASCULARIZATION  1990   BMS PCI to RCA (in [place]) in setting of inferoposterior STEMI   EXERCISE TOLERANCE TEST  08/01/2020   Exercised 3:36 minutes-  Peak HR 137 bpm, peak BP 225/77 mmHg.  MPHR 153 bpm-achieved 89%.  5.3 METS.  Nondiagnostic EKG stress test for ischemia due to resting ST or T wave abnormalities.  Poor functional capacity, with hypertensive response to exercise.  Read As Intermediate Risk   NM MYOVIEW LTD  08/30/2020   EF 45 to 50% with mild diffuse HK.  Large size/severe defect in the basal-mid-apical inferior-inferolateral and apical lateral wall.  Consistent with prior MI in the inferolateral distribution.  No reversibility.  Infarct with no ischemia. => Intermediate Risk because of old infarct and mildly reduced EF, but no evidence of ischemia.   TREADMILL STRESS  ECHO  09/17/2017   Pre-stress normal LV function at rest but no inferior posterior akinesis..  Exercised for: 35 min.  7.4 METS --> fair functional capacity.  Inferior posterior wall akinesis at rest consistent with prior MI --> maintained inferior posterior akinesis with exertion.  No evidence of ischemia.   No Known Allergies Prior to Admission medications   Medication Sig Start Date End Date Taking? Authorizing Provider  aspirin EC 81 MG tablet Take 81 mg by mouth daily.   Yes [provider]  bisoprolol (ZEBETA) 5 MG tablet  Take 1 tablet by mouth once daily 10/15/22  Yes [name], MD  co-enzyme Q-10 30 MG capsule Take 30 mg by mouth 3 (three) times daily.   Yes [provider]  ezetimibe (ZETIA) 10 MG tablet Take 1 tablet (10 mg total) by mouth daily. 03/28/22  Yes Leonie Man, MD  gabapentin (NEURONTIN) 100 MG capsule Take 1 capsule (100 mg total) by mouth 3 (three) times daily as needed. 08/07/22  Yes Lomax, Amy, NP  ketoconazole (NIZORAL) 200 MG tablet Take 2 tablets (400 mg total) by mouth daily. 07/20/21  Yes [name], MD  Multiple Vitamin (MULTI-VITAMINS) TABS Take by mouth.   Yes [provider]  Probiotic Product (PROBIOTIC MATURE ADULT PO) Take 94 mg by mouth.   Yes [provider]  rosuvastatin (CRESTOR) 20 MG tablet Take 1 tablet by mouth once daily 10/15/22  Yes [name], MD  triamcinolone cream (KENALOG) 0.1 % Apply to patches of dry skin 2 times daily as needed 07/21/17  Yes [provider]  TURMERIC PO Take by mouth.   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     He lives with his current wife of 9 years.   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 [employer] [facility] [employer]   Financial Resource Strain: Low Risk  (07/25/2022)   Overall Financial Resource Strain (CARDIA)    Difficulty of Paying Living Expenses: Not very hard  Food Insecurity: No Food Insecurity (07/25/2022)   Hunger Vital Sign    Worried About Running Out of Food in the Last Year: Never true    Ran Out of Food in the Last Year: Never true  Transportation Needs: No Transportation Needs (07/25/2022)   PRAPARE - Hydrologist (Medical): No    Lack of Transportation (Non-Medical): No  Physical Activity: Insufficiently Active (07/25/2022)   Exercise Vital Sign  Days of Exercise per Week: 1 day    Minutes of Exercise per Session: 20 min  Stress: No Stress Concern Present (07/25/2022)   [place]    Feeling of Stress : Not at all  Social Connections: [place] (07/25/2022)   Social Connection and Isolation Panel [NHANES]    Frequency of Communication with Friends and Family: Twice a week    Frequency of Social Gatherings with Friends and Family: Twice a week    Attends Religious Services: More than 4 times per year    Active Member of [employer] or Organizations: Yes    Attends Music therapist: More than 4 times per year    Marital Status: Married  Human resources officer Violence: Not At Risk (07/25/2022)   Humiliation, Afraid, Rape, and Kick questionnaire    Fear of Current or Ex-Partner: No    Emotionally Abused: No    Physically Abused: No    Sexually Abused: No    Review of Systems  Constitutional:  Negative for fatigue and unexpected weight change.  Eyes:  Negative for visual disturbance.  Respiratory:  Negative for cough, chest tightness and shortness of breath.   Cardiovascular:  Negative for chest pain, palpitations and leg swelling.  Gastrointestinal:  Negative for abdominal pain and blood in stool.   Neurological:  Negative for dizziness, light-headedness and headaches.     Objective:   Vitals:   12/02/22 1637  BP: 118/72  Pulse: 63  Temp: 98.1 F (36.7 C)  SpO2: 94%  Weight: (!) 336 lb 3.2 oz (152.5 kg)     Physical Exam Vitals reviewed.  Constitutional:      Appearance: He is well-developed. He is obese.  HENT:     Head: Normocephalic and atraumatic.  Neck:     Vascular: No carotid bruit or JVD.  Cardiovascular:     Rate and Rhythm: Normal rate and regular rhythm.     Heart sounds: Normal heart sounds. No murmur heard. Pulmonary:     Effort: Pulmonary effort is normal.     Breath sounds: Normal breath sounds. No rales.  Musculoskeletal:     Right lower leg: Edema (trace bilat) present.     Left lower leg: Edema present.     Comments: Discomfort with internal rotation of shoulders bilaterally to lower thoracic spine, abduction to approximately 120 degrees, flexion approximately 120-150 degrees.  Bilaterally.  Skin:    General: Skin is warm and dry.  Neurological:     Mental Status: He is alert and oriented to person, place, and time.  Psychiatric:        Mood and Affect: Mood normal.     Assessment & Plan:  Rashun Grattan is a 72 y.o. male . Hyperlipidemia, unspecified hyperlipidemia type - Plan: Comprehensive metabolic panel, Lipid panel  Bilateral shoulder pain, unspecified chronicity Check lipids.  Noted bilateral shoulder pain, soon after starting Zetia.  Unlikely cause but will try stopping Zetia temporarily, recheck in 2 weeks, repeat exam  and if persistent shoulder pain, restart Zetia as improved LDL goal planned.  Possible underlying degenerative joint disease, differential includes adhesive capsulitis.  Consider injection, ortho eval.   No orders of the defined types were placed in this encounter.  Patient Instructions  Stop zetia for 2 weeks, but if shoulder pain does not improve return to look at shoulder pain further and restart zetia.  No  other changes for now.  Thanks for coming in today.  Signed,   Merri Ray, MD [facility] 12/02/22 5:43 PM

## 2022-12-02 NOTE — Patient Instructions (Addendum)
Stop zetia for 2 weeks, but if shoulder pain does not improve return to look at shoulder pain further and restart zetia.  No other changes for now.  Thanks for coming in today.

## 2022-12-03 LAB — COMPREHENSIVE METABOLIC PANEL
ALT: 14 U/L (ref 0–53)
AST: 15 U/L (ref 0–37)
Albumin: 4.2 g/dL (ref 3.5–5.2)
Alkaline Phosphatase: 35 U/L — ABNORMAL LOW (ref 39–117)
BUN: 19 mg/dL (ref 6–23)
CO2: 27 mEq/L (ref 19–32)
Calcium: 9.2 mg/dL (ref 8.4–10.5)
Chloride: 105 mEq/L (ref 96–112)
Creatinine, Ser: 1.02 mg/dL (ref 0.40–1.50)
GFR: 74.85 mL/min (ref >60.00)
Glucose, Bld: 83 mg/dL (ref 70–99)
Potassium: 4.8 mEq/L (ref 3.5–5.1)
Sodium: 139 mEq/L (ref 135–145)
Total Bilirubin: 0.3 mg/dL (ref 0.2–1.2)
Total Protein: 7.6 g/dL (ref 6.0–8.3)

## 2022-12-03 LAB — LIPID PANEL
Cholesterol: 122 mg/dL (ref 0–200)
HDL: 54.2 mg/dL (ref >39.00)
LDL Cholesterol: 54 mg/dL (ref 0–99)
NonHDL: 68.22
Total CHOL/HDL Ratio: 2
Triglycerides: 70 mg/dL (ref 0.0–149.0)
VLDL: 14 mg/dL (ref 0.0–40.0)

## 2022-12-16 ENCOUNTER — Ambulatory Visit (INDEPENDENT_AMBULATORY_CARE_PROVIDER_SITE_OTHER): Payer: Medicare Other | Admitting: Family Medicine

## 2022-12-16 VITALS — BP 128/78 | HR 62 | Temp 97.6°F | Ht 71.0 in | Wt 335.4 lb

## 2022-12-16 DIAGNOSIS — M25512 Pain in left shoulder: Secondary | ICD-10-CM

## 2022-12-16 DIAGNOSIS — E785 Hyperlipidemia, unspecified: Secondary | ICD-10-CM

## 2022-12-16 DIAGNOSIS — M25511 Pain in right shoulder: Secondary | ICD-10-CM | POA: Diagnosis not present

## 2022-12-16 DIAGNOSIS — L853 Xerosis cutis: Secondary | ICD-10-CM

## 2022-12-16 MED ORDER — TRIAMCINOLONE ACETONIDE 0.1 % EX CREA: TOPICAL_CREAM | CUTANEOUS | 1 refills | Status: AC

## 2022-12-16 NOTE — Patient Instructions (Addendum)
I recommend discussing the shoulder pain and improvement off the Zetia with your cardiologist.  If pain continues, recommend meeting with shoulder specialist.  Glad to hear you are doing better.  I refilled the triamcinolone cream for dry patches of skin, can also use Eucerin or Aveeno lotion over-the-counter.  If those areas do not improve with use of that cream within a few weeks, I would recommend evaluation with dermatology as we discussed.  Take care

## 2022-12-16 NOTE — Progress Notes (Signed)
Subjective:  Patient ID: Bruce Williams, male    DOB: February 05, 1951  Age: 72 y.o. MRN: [id number]  CC:  Chief Complaint  Patient presents with   Shoulder Pain    Pt notes pain is better (no resolved) seems to be doing better,     HPI Bruce Williams presents for   Bilateral shoulder pain: Follow up form 12/02/22 visit.  Much better. R shoulder no pain, left shoulder resolving - 10x better. Able to use without difficulty. Able to lift better. Not needing voltaren gel in past 5 days.  Stopped Zetia last visit - feels like this was cause.  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  No change in activity.   Dry skin patches, used TAC in past has helped.   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/16/2021   OSA on CPAP 03/08/2021   Abnormal stress electrocardiogram test using treadmill 08/07/2020   Essential hypertension 08/31/2018   Irregular heart beats 08/31/2018   Encounter for physical examination related to employment 08/31/2018   Coronary artery disease involving native heart without angina pectoris    Hyperlipidemia with target LDL less than 70    Past Medical History:  Diagnosis Date   Coronary artery disease involving native heart without angina pectoris 1998   No angina since MI in [place] Echo 08/2017   History of acute Inferior-Posterior wall MI 1998   [place] - BMS PCI RCA (inferoposterior Akinesis on Echo) & Large Sized - Severe Intesity Fixed Perfusion Defect in Basal-mid-apical Inferior-inferolateral & apical lateral wall c/w prior Infarct (no peri-infarct ischemia)   Hyperlipidemia with target low density lipoprotein (LDL) cholesterol less than 70 mg/dL    OSA on CPAP    Sleep apnea    Phreesia 02/06/2021   Past Surgical History:  Procedure Laterality Date   ANKLE  FRACTURE SURGERY Right 1980's   CORONARY/GRAFT ACUTE MI REVASCULARIZATION  1990   BMS PCI to RCA (in [place]) in setting of inferoposterior STEMI   EXERCISE TOLERANCE TEST  08/01/2020   Exercised 3:36 minutes-  Peak HR 137 bpm, peak BP 225/77 mmHg.  MPHR 153 bpm-achieved 89%.  5.3 METS.  Nondiagnostic EKG stress test for ischemia due to resting ST or T wave abnormalities.  Poor functional capacity, with hypertensive response to exercise.  Read As Intermediate Risk   NM MYOVIEW LTD  08/30/2020   EF 45 to 50% with mild diffuse HK.  Large size/severe defect in the basal-mid-apical inferior-inferolateral and apical lateral wall.  Consistent with prior MI in the inferolateral distribution.  No reversibility.  Infarct with no ischemia. => Intermediate Risk because of old infarct and mildly reduced EF, but no evidence of ischemia.   TREADMILL STRESS ECHO  09/17/2017   Pre-stress normal LV function at rest but no inferior posterior akinesis..  Exercised for: 35 min.  7.4 METS --> fair functional capacity.  Inferior posterior wall akinesis at rest consistent with prior MI --> maintained inferior posterior akinesis with exertion.  No evidence of ischemia.   No Known Allergies Prior to Admission medications   Medication Sig Start Date End Date Taking? Authorizing Provider  aspirin EC 81 MG tablet Take 81 mg by mouth daily.   Yes [provider]  bisoprolol (ZEBETA) 5 MG tablet Take 1 tablet by mouth once daily 10/15/22  Yes [name], MD  co-enzyme Q-10 30 MG capsule Take 30 mg by mouth 3 (three) times daily.   Yes [provider]  gabapentin (NEURONTIN) 100 MG capsule Take 1 capsule (100 mg total) by mouth 3 (three) times daily as needed. 08/07/22  Yes Lomax, Amy, NP  ketoconazole (NIZORAL) 200 MG tablet Take 2 tablets (400 mg total) by mouth daily. 07/20/21  Yes [name], MD  Multiple Vitamin (MULTI-VITAMINS) TABS Take by mouth.   Yes [provider]  Probiotic  Product (PROBIOTIC MATURE ADULT PO) Take 94 mg by mouth.   Yes [provider]  rosuvastatin (CRESTOR) 20 MG tablet Take 1 tablet by mouth once daily 10/15/22  Yes [name], MD  triamcinolone cream (KENALOG) 0.1 % Apply to patches of dry skin 2 times daily as needed 07/21/17  Yes [provider]  TURMERIC PO Take by mouth.   Yes [provider]  ezetimibe (ZETIA) 10 MG tablet Take 1 tablet (10 mg total) by mouth daily. 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     He lives with his current wife of 9 years.   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[employer] [facility] [employer]   Financial Resource Strain: Low Risk  (07/25/2022)   Overall Financial Resource Strain (CARDIA)    Difficulty of Paying Living Expenses: Not very hard  Food Insecurity: No Food Insecurity (07/25/2022)   Hunger Vital Sign    Worried About Running Out of Food in the Last Year: Never true    Ran Out of Food in the Last Year: Never true  Transportation Needs: No Transportation Needs (07/25/2022)   PRAPARE -  Hydrologist (Medical): No    Lack of Transportation (Non-Medical): No  Physical Activity: Insufficiently Active (07/25/2022)   Exercise Vital Sign    Days of Exercise per Week: 1 day    Minutes of Exercise per Session: 20 min  Stress: No Stress Concern Present (07/25/2022)   [place]    Feeling of Stress : Not at all  Social Connections: [place] (07/25/2022)   Social Connection and Isolation Panel [NHANES]    Frequency of Communication with Friends and Family: Twice a week    Frequency of Social Gatherings with Friends and Family: Twice a week    Attends Religious Services: More than 4 times per  year    Active Member of Clubs or Organizations: Yes    Attends Archivist Meetings: More than 4 times per year    Marital Status: Married  Human resources officer Violence: Not At Risk (07/25/2022)   Humiliation, Afraid, Rape, and Kick questionnaire    Fear of Current or Ex-Partner: No    Emotionally Abused: No    Physically Abused: No    Sexually Abused: No    Review of Systems  Per HPI Objective:   Vitals:   12/16/22 1617  BP: 128/78  Pulse: 62  Temp: 97.6 F (36.4 C)  TempSrc: Oral  SpO2: 97%  Weight: (!) 335 lb 6.4 oz (152.1 kg)  Height: '5\' 11"'$  (1.803 m)     Physical Exam Constitutional:      General: He is not in acute distress.    Appearance: Normal appearance. He is well-developed.  HENT:     Head: Normocephalic and atraumatic.  Cardiovascular:     Rate and Rhythm: Normal rate.  Pulmonary:     Effort: Pulmonary effort is normal.  Musculoskeletal:     Comments: Right shoulder, pain-free range of motion, full range of motion, negative rotator cuff testing, Neer, Hawkins.  Left shoulder, no bony tenderness, full range of motion, some discomfort at terminal internal rotation but otherwise intact range of motion and pain-free exam.  Full rotator cuff strength,  negative Neer/Hawkins.  Skin:    Comments: Few dry patches on forearm on left.  Slight scaling.  Neurological:     Mental Status: He is alert and oriented to person, place, and time.  Psychiatric:        Mood and Affect: Mood normal.        Assessment & Plan:  Bruce Williams is a 72 y.o. male . Bilateral shoulder pain, unspecified chronicity - Plan: triamcinolone cream (KENALOG) 0.1 %  -Shoulder pain has improved, notably with coming off Zetia.  Certainly could have component of degenerative joint disease or possible adhesive capsulitis but unlikely to be improving that quickly.  Will hold on Zetia for now and he can communicate these findings to his cardiologist to decide if other meds needed.  Dry skin dermatitis - Plan: triamcinolone cream (KENALOG) 0.1 %  -Episodic use of triamcinolone with hydrating lotion discussed, but dermatology eval recommended if not clearing up areas within a few weeks.  Meds ordered this encounter  Medications   triamcinolone cream (KENALOG) 0.1 %    Sig: Apply to patches of dry skin 2 times daily as needed    Dispense:  30 g    Refill:  1   Patient Instructions  I recommend discussing the shoulder pain and improvement off the Zetia with your cardiologist.  If pain continues, recommend meeting with shoulder specialist.  Glad to hear you are doing better.  I refilled the triamcinolone cream for dry patches of skin, can also use Eucerin or Aveeno lotion over-the-counter.  If those areas do not improve with use of that cream within a few weeks, I would recommend evaluation with dermatology as we discussed.  Take care     Signed,   Merri Ray, MD [place] 12/17/22 3:09 PM

## 2022-12-17 ENCOUNTER — Encounter: Payer: Self-pay | Admitting: Family Medicine

## 2023-01-16 ENCOUNTER — Other Ambulatory Visit: Payer: Self-pay | Admitting: Family Medicine

## 2023-01-16 DIAGNOSIS — I2510 Atherosclerotic heart disease of native coronary artery without angina pectoris: Secondary | ICD-10-CM

## 2023-01-16 DIAGNOSIS — E785 Hyperlipidemia, unspecified: Secondary | ICD-10-CM

## 2023-04-18 ENCOUNTER — Other Ambulatory Visit: Payer: Self-pay | Admitting: Family Medicine

## 2023-04-18 DIAGNOSIS — I2510 Atherosclerotic heart disease of native coronary artery without angina pectoris: Secondary | ICD-10-CM

## 2023-04-18 DIAGNOSIS — E785 Hyperlipidemia, unspecified: Secondary | ICD-10-CM

## 2023-05-05 ENCOUNTER — Encounter: Payer: Self-pay | Admitting: Cardiology

## 2023-05-06 NOTE — Telephone Encounter (Signed)
Not a very common side effect from medication that works in the stomach.  However, we will try a different option.   Although muscle aching and cramping is a concern, we also need to take into consideration cardiovascular risk.  Lets try Nexletol 180 mg daily  . D/c Zetia Start new Rx - Nexletol 180 mg daily; disp # 30 tab, 3 refills.  DH

## 2023-05-07 ENCOUNTER — Telehealth: Payer: Self-pay

## 2023-05-07 MED ORDER — NEXLETOL 180 MG PO TABS: 180.0000 mg | ORAL_TABLET | Freq: Every day | ORAL | 3 refills | Status: DC

## 2023-05-07 NOTE — Addendum Note (Signed)
Addended by: Tobin Chad on: 05/07/2023 10:11 AM   Modules accepted: Orders

## 2023-05-07 NOTE — Telephone Encounter (Signed)
PA initiated, please see separate encounter for updates on determination. (I will route you back in once a decision has been made)  Kaydon Husby, CPhT Pharmacy Patient Advocate Specialist Direct Number: [phone] Fax: [phone]  

## 2023-05-07 NOTE — Telephone Encounter (Signed)
Pharmacy Patient Advocate Encounter   Received notification from [employer] that prior authorization for NEXLETOL is needed.    PA submitted on 05/07/23 VIA [employer] CASE ID [id number] Status is pending  Haze Rushing, CPhT Pharmacy Patient Advocate Specialist Direct Number: [phone] Fax: [phone]

## 2023-05-07 NOTE — Telephone Encounter (Signed)
Need prior authorization for Nexletol 180 mg one tablet daily   [name] (Key: [id number]) covermymeds   Sent information to be proceed by prior [place]. team

## 2023-05-12 ENCOUNTER — Other Ambulatory Visit (HOSPITAL_COMMUNITY): Payer: Self-pay

## 2023-05-13 ENCOUNTER — Other Ambulatory Visit (HOSPITAL_COMMUNITY): Payer: Self-pay

## 2023-05-16 ENCOUNTER — Other Ambulatory Visit (HOSPITAL_COMMUNITY): Payer: Self-pay

## 2023-05-19 ENCOUNTER — Other Ambulatory Visit (HOSPITAL_COMMUNITY): Payer: Self-pay

## 2023-05-19 NOTE — Telephone Encounter (Signed)
Called plan to troubleshoot. Plan cannot match patient with insurance card info, name, and DOB. Still trying to troubleshoot with plan to check PA status.

## 2023-05-19 NOTE — Telephone Encounter (Signed)
No follow up from plan. Tried to resubmit in [facility], says someone has an open request pending for him in [facility] already.

## 2023-05-19 NOTE — Telephone Encounter (Signed)
Was able to finally get to the right plan number and close out the pending request. Faxing over documentation now. Turnaround time 72 hours. ZO#[id number]

## 2023-05-21 NOTE — Telephone Encounter (Signed)
Pharmacy Patient Advocate Encounter  Received notification from [employer] that the request for prior authorization for NEXLETOL has been denied due to MEDICAL NECESSITY  Please note, request was submitted under ICD I25.10, E78.5 with chart notes from most recent cardiac visit.   PLEASE ADVISE  Haze Rushing, CPhT Pharmacy Patient Advocate Specialist Direct Number: [phone] Fax: [phone]

## 2023-05-22 NOTE — Telephone Encounter (Signed)
Yes he was post to get lipids checked in about 2 months from the clinic visit and then reassess since he had stopped his previous medicine.  If we can get him in for the recheck of her lipids   Bryan Lemma, MD

## 2023-05-31 NOTE — Progress Notes (Unsigned)
Cardiology Clinic Note   Patient Name: Bruce Williams Date of Encounter: 05/31/2023  Primary Care Provider:  Shade Flood, MD Primary Cardiologist:  Bryan Lemma, MD  Patient Profile    72 yr old male with hx of HL, CAD PCI to RCA in 1998, , HTN, OSA on CPAP, requires DOT physical. Every 2 yeas. Last seen by Dr.Harding on 03/19/2022.    Past Medical History     Past Medical History:  Diagnosis Date   Coronary artery disease involving native heart without angina pectoris 1998   No angina since MI in 1998 - Non-ischemic Stress Echo 08/2017   History of acute Inferior-Posterior wall MI 1998   ([place] - BMS PCI RCA (inferoposterior Akinesis on Echo) & Large Sized - Severe Intesity Fixed Perfusion Defect in Basal-mid-apical Inferior-inferolateral & apical lateral wall c/w prior Infarct (no peri-infarct ischemia)   Hyperlipidemia with target low density lipoprotein (LDL) cholesterol less than 70 mg/dL    OSA on CPAP    Sleep apnea    Phreesia 02/06/2021   Past Surgical History:  Procedure Laterality Date   ANKLE FRACTURE SURGERY Right 1980's   CORONARY/GRAFT ACUTE MI REVASCULARIZATION  1990   BMS PCI to RCA (in [place]) in setting of inferoposterior STEMI   EXERCISE TOLERANCE TEST  08/01/2020   Exercised 3:36 minutes-  Peak HR 137 bpm, peak BP 225/77 mmHg.  MPHR 153 bpm-achieved 89%.  5.3 METS.  Nondiagnostic EKG stress test for ischemia due to resting ST or T wave abnormalities.  Poor functional capacity, with hypertensive response to exercise.  Read As Intermediate Risk   NM MYOVIEW LTD  08/30/2020   EF 45 to 50% with mild diffuse HK.  Large size/severe defect in the basal-mid-apical inferior-inferolateral and apical lateral wall.  Consistent with prior MI in the inferolateral distribution.  No reversibility.  Infarct with no ischemia. => Intermediate Risk because of old infarct and mildly reduced EF, but no evidence of ischemia.   TREADMILL STRESS ECHO  09/17/2017   Pre-stress  normal LV function at rest but no inferior posterior akinesis..  Exercised for: 35 min.  7.4 METS --> fair functional capacity.  Inferior posterior wall akinesis at rest consistent with prior MI --> maintained inferior posterior akinesis with exertion.  No evidence of ischemia.    Allergies  Allergies  Allergen Reactions   Zetia [Ezetimibe] Other (See Comments)    Muscle aches and joint pain - stopped    History of Present Illness    ***  Home Medications    Current Outpatient Medications  Medication Bruce Dispense Refill   aspirin EC 81 MG tablet Take 81 mg by mouth daily.     Bempedoic Acid (NEXLETOL) 180 MG TABS Take 1 tablet (180 mg total) by mouth daily. 30 tablet 3   bisoprolol (ZEBETA) 5 MG tablet Take 1 tablet by mouth once daily 90 tablet 0   co-enzyme Q-10 30 MG capsule Take 30 mg by mouth 3 (three) times daily.     gabapentin (NEURONTIN) 100 MG capsule Take 1 capsule (100 mg total) by mouth 3 (three) times daily as needed. 270 capsule 3   ketoconazole (NIZORAL) 200 MG tablet Take 2 tablets (400 mg total) by mouth daily. 2 tablet 1   Multiple Vitamin (MULTI-VITAMINS) TABS Take by mouth.     Probiotic Product (PROBIOTIC MATURE ADULT PO) Take 94 mg by mouth.     rosuvastatin (CRESTOR) 20 MG tablet Take 1 tablet by mouth once daily 90 tablet 0  triamcinolone cream (KENALOG) 0.1 % Apply to patches of dry skin 2 times daily as needed 30 g 1   TURMERIC PO Take by mouth.     No current facility-administered medications for this visit.     Family History    Family History  Problem Relation Age of Onset   CAD Mother    Diabetes Mother    CAD Father    Dementia Father    He indicated that his mother is deceased. He indicated that his father is deceased.  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     He lives with his current wife of 9 years.   R handed   Caffeine: 2 C of coffee a day   Drives a truck having CDL license.  [place] a mile radius based out of [employer] [facility] [employer]   Financial Resource Strain: Low Risk  (07/25/2022)   Overall Financial Resource Strain (CARDIA)    Difficulty of Paying Living Expenses: Not very hard  Food Insecurity: No Food Insecurity (07/25/2022)   Hunger Vital Sign    Worried About Running Out of Food in the Last Year: Never true    Ran Out of Food in the Last Year: Never true  Transportation Needs: No Transportation Needs (07/25/2022)   PRAPARE - Administrator, Civil Service (Medical): No    Lack of Transportation (Non-Medical): No  Physical Activity: Insufficiently Active (07/25/2022)   Exercise Vital Sign    Days of Exercise per Week: 1 day    Minutes of Exercise per Session: 20 min  Stress: No Stress Concern Present (07/25/2022)   [employer] of Occupational Health - Occupational Stress Questionnaire    Feeling of Stress : Not at all  Social Connections: Socially Integrated (07/25/2022)   Social Connection and Isolation Panel [NHANES]    Frequency of Communication with Friends and Family: Twice a week    Frequency of Social Gatherings with Friends and Family: Twice a week    Attends Religious Services: More than 4 times per year    Active  Member of [employer] or Organizations: Yes    Attends Engineer, structural: More than 4 times per year    Marital Status: Married  Catering manager Violence: Not At Risk (07/25/2022)   Humiliation, Afraid, Rape, and Kick questionnaire    Fear of Current or Ex-Partner: No    Emotionally Abused: No    Physically Abused: No    Sexually Abused: No     Review of Systems    General:  No chills, fever, night sweats or weight changes.  Cardiovascular:  No chest pain, dyspnea on exertion, edema, orthopnea, palpitations, paroxysmal nocturnal dyspnea. Dermatological: No rash, lesions/masses Respiratory: No cough, dyspnea Urologic: No hematuria, dysuria Abdominal:   No nausea, vomiting, diarrhea, bright red blood per rectum, melena, or hematemesis Neurologic:  No visual changes, wkns, changes in mental status. All other systems reviewed and are otherwise negative except as noted above.       Physical Exam    VS:  There were no vitals taken for this visit. , BMI There is no height or weight on file to calculate BMI.     GEN: Well nourished, well developed, in no acute distress. HEENT: normal. Neck: Supple, no JVD, carotid bruits, or masses. Cardiac: RRR, no murmurs, rubs, or gallops. No clubbing, cyanosis, edema.  Radials/DP/PT 2+ and equal bilaterally.  Respiratory:  Respirations regular and unlabored, clear to auscultation bilaterally. GI: Soft, nontender, nondistended, BS + x 4. MS: no deformity or atrophy. Skin: warm and dry, no rash. Neuro:  Strength and sensation are intact. Psych: Normal affect.      No results found for: "WBC", "HGB", "HCT", "MCV", "PLT" Lab Results  Component Value Date   CREATININE 1.02 12/02/2022   BUN 19 12/02/2022   NA 139 12/02/2022   K 4.8 12/02/2022   CL 105 12/02/2022   CO2 27 12/02/2022   Lab Results  Component Value Date   ALT 14 12/02/2022   AST 15 12/02/2022   ALKPHOS 35 (L) 12/02/2022   BILITOT 0.3 12/02/2022   Lab Results   Component Value Date   CHOL 122 12/02/2022   HDL 54.20 12/02/2022   LDLCALC 54 12/02/2022   TRIG 70.0 12/02/2022   CHOLHDL 2 12/02/2022    No results found for: "HGBA1C"   Review of Prior Studies     EXERCISE TOLERANCE TEST   08/01/2020     Exercised 3:36 minutes-  Peak HR 137 bpm, peak BP 225/77 mmHg.  MPHR 153 bpm-achieved 89%.  5.3 METS.  Nondiagnostic EKG stress test for ischemia due to resting ST or T wave abnormalities.  Poor functional capacity, with hypertensive response to exercise.  Read As Intermediate Risk   NM MYOVIEW LTD   08/30/2020    EF 45 to 50% with mild diffuse HK.  Large size/severe defect in the basal-mid-apical inferior-inferolateral and apical lateral wall.  Consistent with prior MI in the inferolateral distribution.  No reversibility.  Infarct with no ischemia. => Intermediate Risk because of old infarct and mildly reduced EF, but no evidence of ischemia.   TREADMILL STRESS ECHO   09/17/2017    Pre-stress normal LV function at rest but no inferior posterior akinesis..  Exercised for: 35 min.  7.4 METS --> fair functional capacity.  Inferior posterior wall akinesis at rest consistent with prior MI --> maintained inferior posterior akinesis with exertion.  No evidence of ischemia.  Exercise Tolerance Test 08/01/2020: Exercised 3:36 minutes-  Peak HR 137 bpm, peak BP 225/77 mmHg.  MPHR 153 bpm-achieved 89%.  5.3 METS.  Nondiagnostic EKG stress test for ischemia due to resting ST or T wave abnormalities.  Poor functional capacity, with hypertensive response to exercise.  Read As Intermediate Risk Myoview 08/30/2020: EF 45 to 50% with mild diffuse HK.  Large size/severe defect in the basal-mid-apical inferior-inferolateral and apical lateral wall.  Consistent with prior MI in the inferolateral distribution.  No reversibility.  Infarct with no ischemia. => Intermediate Risk because of old infarct and mildly reduced EF, but no evidence of ischemia.  Assessment &  Plan   1.   ***     {Are you ordering a CV Procedure (e.g. stress test, cath, DCCV, TEE, etc)?   Press F2        :[id number]}   Signed, Bettey Mare. Liborio Nixon, ANP, AACC   05/31/2023 8:42 AM      Office [phone] Fax [phone]  Notice: This dictation was prepared with Dragon dictation along with smaller phrase technology. Any transcriptional errors that result from this process are unintentional and may not be corrected upon review.

## 2023-06-05 ENCOUNTER — Ambulatory Visit: Payer: 59 | Attending: Adult Health | Admitting: Adult Health

## 2023-06-05 ENCOUNTER — Encounter: Payer: Self-pay | Admitting: Adult Health

## 2023-06-05 VITALS — BP 124/76 | HR 62 | Ht 71.0 in | Wt 327.0 lb

## 2023-06-05 DIAGNOSIS — Z6841 Body Mass Index (BMI) 40.0 and over, adult: Secondary | ICD-10-CM | POA: Diagnosis not present

## 2023-06-05 DIAGNOSIS — E7800 Pure hypercholesterolemia, unspecified: Secondary | ICD-10-CM

## 2023-06-05 DIAGNOSIS — I10 Essential (primary) hypertension: Secondary | ICD-10-CM | POA: Diagnosis not present

## 2023-06-05 DIAGNOSIS — I2510 Atherosclerotic heart disease of native coronary artery without angina pectoris: Secondary | ICD-10-CM | POA: Diagnosis not present

## 2023-06-05 DIAGNOSIS — G4733 Obstructive sleep apnea (adult) (pediatric): Secondary | ICD-10-CM | POA: Diagnosis not present

## 2023-06-05 NOTE — Patient Instructions (Signed)
Medication Instructions:  No Changes *If you need a refill on your cardiac medications before your next appointment, please call your pharmacy*   Lab Work: No Labs If you have labs (blood work) drawn today and your tests are completely normal, you will receive your results only by: MyChart Message (if you have MyChart) OR A paper copy in the mail If you have any lab test that is abnormal or we need to change your treatment, we will call you to review the results.   Testing/Procedures: [address]. ( July 2025) Your physician has requested that you have a lexiscan myoview. For further information please visit [email or web address] Please follow instruction sheet, as given.    Follow-Up: At [facility], you and your health needs are our priority.  As part of our continuing mission to provide you with exceptional heart care, we have created designated Provider Care Teams.  These Care Teams include your primary Cardiologist (physician) and Advanced Practice Providers (APPs -  Physician Assistants and Nurse Practitioners) who all work together to provide you with the care you need, when you need it.  We recommend signing up for the patient portal called "MyChart".  Sign up information is provided on this After Visit Summary.  MyChart is used to connect with patients for Virtual Visits (Telemedicine).  Patients are able to view lab/test results, encounter notes, upcoming appointments, etc.  Non-urgent messages can be sent to your provider as well.   To learn more about what you can do with MyChart, go to [email or web address].    Your next appointment:   1 year(s)  Provider:   Bryan Lemma, MD

## 2023-06-25 ENCOUNTER — Ambulatory Visit: Payer: Medicare Other | Admitting: Cardiovascular Disease

## 2023-07-18 ENCOUNTER — Other Ambulatory Visit: Payer: Self-pay | Admitting: Family Medicine

## 2023-07-18 DIAGNOSIS — E785 Hyperlipidemia, unspecified: Secondary | ICD-10-CM

## 2023-07-18 DIAGNOSIS — I2510 Atherosclerotic heart disease of native coronary artery without angina pectoris: Secondary | ICD-10-CM

## 2023-08-13 ENCOUNTER — Telehealth: Payer: Self-pay | Admitting: Family Medicine

## 2023-08-13 ENCOUNTER — Ambulatory Visit (INDEPENDENT_AMBULATORY_CARE_PROVIDER_SITE_OTHER): Payer: Medicare Other | Admitting: *Deleted

## 2023-08-13 ENCOUNTER — Ambulatory Visit (INDEPENDENT_AMBULATORY_CARE_PROVIDER_SITE_OTHER): Payer: Medicare Other | Admitting: Family Medicine

## 2023-08-13 ENCOUNTER — Encounter: Payer: Self-pay | Admitting: Family Medicine

## 2023-08-13 VITALS — BP 124/68 | HR 75 | Temp 97.8°F | Wt 332.6 lb

## 2023-08-13 DIAGNOSIS — E785 Hyperlipidemia, unspecified: Secondary | ICD-10-CM | POA: Diagnosis not present

## 2023-08-13 DIAGNOSIS — M25571 Pain in right ankle and joints of right foot: Secondary | ICD-10-CM

## 2023-08-13 DIAGNOSIS — I2510 Atherosclerotic heart disease of native coronary artery without angina pectoris: Secondary | ICD-10-CM | POA: Diagnosis not present

## 2023-08-13 DIAGNOSIS — Z0000 Encounter for general adult medical examination without abnormal findings: Secondary | ICD-10-CM

## 2023-08-13 DIAGNOSIS — I10 Essential (primary) hypertension: Secondary | ICD-10-CM

## 2023-08-13 DIAGNOSIS — Z1211 Encounter for screening for malignant neoplasm of colon: Secondary | ICD-10-CM

## 2023-08-13 LAB — LIPID PANEL
Cholesterol: 136 mg/dL (ref 0–200)
HDL: 61.2 mg/dL (ref >39.00)
LDL Cholesterol: 62 mg/dL (ref 0–99)
NonHDL: 74.5
Total CHOL/HDL Ratio: 2
Triglycerides: 65 mg/dL (ref 0.0–149.0)
VLDL: 13 mg/dL (ref 0.0–40.0)

## 2023-08-13 LAB — COMPREHENSIVE METABOLIC PANEL WITH GFR
ALT: 14 U/L (ref 0–53)
AST: 16 U/L (ref 0–37)
Albumin: 4 g/dL (ref 3.5–5.2)
Alkaline Phosphatase: 42 U/L (ref 39–117)
BUN: 15 mg/dL (ref 6–23)
CO2: 27 meq/L (ref 19–32)
Calcium: 9.4 mg/dL (ref 8.4–10.5)
Chloride: 105 meq/L (ref 96–112)
Creatinine, Ser: 0.96 mg/dL (ref 0.40–1.50)
GFR: 80.1 mL/min (ref >60.00)
Glucose, Bld: 93 mg/dL (ref 70–99)
Potassium: 4.4 meq/L (ref 3.5–5.1)
Sodium: 138 meq/L (ref 135–145)
Total Bilirubin: 0.5 mg/dL (ref 0.2–1.2)
Total Protein: 7.5 g/dL (ref 6.0–8.3)

## 2023-08-13 MED ORDER — BISOPROLOL FUMARATE 5 MG PO TABS
5.0000 mg | ORAL_TABLET | Freq: Every day | ORAL | 2 refills | Status: DC
Start: 2023-08-13 — End: 2023-10-16

## 2023-08-13 MED ORDER — ROSUVASTATIN CALCIUM 20 MG PO TABS
20.0000 mg | ORAL_TABLET | Freq: Every day | ORAL | 2 refills | Status: DC
Start: 2023-08-13 — End: 2023-10-16

## 2023-08-13 NOTE — Patient Instructions (Signed)
No medication changes at this time.  Depending on your cholesterol levels we can discuss whether to do the higher dose of statin, alternating higher and current dose of statin, or meeting with cardiology to discuss statin medication like Repatha.  Again we can discuss this once we see your levels.  Blood pressure stable.  Thanks for coming in today and take care.

## 2023-08-13 NOTE — Telephone Encounter (Signed)
Pt is having issues with the [facility] in [place] transferring his prescriptions to the [facility] in [place]. The pharmacy told him he needed to speak with his doctor about this. I also informed pt to mention to Dr. Neva Seat during today's OV.

## 2023-08-13 NOTE — Progress Notes (Signed)
Subjective:   Bruce Williams is a 72 y.o. male who presents for Medicare Annual/Subsequent preventive examination.  Visit Complete: Virtual  I connected with  [name] on 08/13/23 by a audio enabled telemedicine application and verified that I am speaking with the correct person using two identifiers.  Patient Location: Home  Provider Location: Home Office  I discussed the limitations of evaluation and management by telemedicine. The patient expressed understanding and agreed to proceed.  Patient Medicare AWV questionnaire was completed by the patient on 08-13-2023; I have confirmed that all information answered by patient is correct and no changes since this date.  Cardiac Risk Factors include: advanced age (>18men, >75 women);male gender;family history of premature cardiovascular disease;hypertension     Objective:    There were no vitals filed for this visit. There is no height or weight on file to calculate BMI.     07/25/2022    4:19 PM 02/09/2021    4:34 PM 01/03/2020    9:49 AM 11/02/2019    8:12 AM 08/31/2018    1:28 PM  Advanced Directives  Does Patient Have a Medical Advance Directive? No No No No No  Would patient like information on creating a medical advance directive? No - Patient declined Yes (MAU/Ambulatory/Procedural Areas - Information given) No - Patient declined Yes (ED - Information included in AVS) No - Patient declined    Current Medications (verified) Outpatient Encounter Medications as of 08/13/2023  Medication Sig   aspirin EC 81 MG tablet Take 81 mg by mouth daily.   bisoprolol (ZEBETA) 5 MG tablet Take 1 tablet (5 mg total) by mouth daily.   co-enzyme Q-10 30 MG capsule Take 30 mg by mouth 3 (three) times daily.   gabapentin (NEURONTIN) 100 MG capsule Take 1 capsule (100 mg total) by mouth 3 (three) times daily as needed.   ketoconazole (NIZORAL) 200 MG tablet Take 2 tablets (400 mg total) by mouth daily.   Multiple Vitamin (MULTI-VITAMINS) TABS  Take by mouth.   Probiotic Product (PROBIOTIC MATURE ADULT PO) Take 94 mg by mouth.   rosuvastatin (CRESTOR) 20 MG tablet Take 1 tablet (20 mg total) by mouth daily.   triamcinolone cream (KENALOG) 0.1 % Apply to patches of dry skin 2 times daily as needed   TURMERIC PO Take by mouth.   No facility-administered encounter medications on file as of 08/13/2023.    Allergies (verified) Zetia [ezetimibe]   History: Past Medical History:  Diagnosis Date   Coronary artery disease involving native heart without angina pectoris 1998   No angina since MI in 1998 - Non-ischemic Stress Echo 08/2017   History of acute Inferior-Posterior wall MI 1998   [place] - BMS PCI RCA (inferoposterior Akinesis on Echo) & Large Sized - Severe Intesity Fixed Perfusion Defect in Basal-mid-apical Inferior-inferolateral & apical lateral wall c/w prior Infarct (no peri-infarct ischemia)   Hyperlipidemia with target low density lipoprotein (LDL) cholesterol less than 70 mg/dL    OSA on CPAP    Sleep apnea    Phreesia 02/06/2021   Past Surgical History:  Procedure Laterality Date   ANKLE FRACTURE SURGERY Right 1980's   CORONARY/GRAFT ACUTE MI REVASCULARIZATION  1990   BMS PCI to RCA (in [place]) in setting of inferoposterior STEMI   EXERCISE TOLERANCE TEST  08/01/2020   Exercised 3:36 minutes-  Peak HR 137 bpm, peak BP 225/77 mmHg.  MPHR 153 bpm-achieved 89%.  5.3 METS.  Nondiagnostic EKG stress test for ischemia due to resting ST or  T wave abnormalities.  Poor functional capacity, with hypertensive response to exercise.  Read As Intermediate Risk   NM MYOVIEW LTD  08/30/2020   EF 45 to 50% with mild diffuse HK.  Large size/severe defect in the basal-mid-apical inferior-inferolateral and apical lateral wall.  Consistent with prior MI in the inferolateral distribution.  No reversibility.  Infarct with no ischemia. => Intermediate Risk because of old infarct and mildly reduced EF, but no evidence of ischemia.   TREADMILL  STRESS ECHO  09/17/2017   Pre-stress normal LV function at rest but no inferior posterior akinesis..  Exercised for: 35 min.  7.4 METS --> fair functional capacity.  Inferior posterior wall akinesis at rest consistent with prior MI --> maintained inferior posterior akinesis with exertion.  No evidence of ischemia.   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     He lives with his current wife of 9 years.   R handed   Caffeine: 2 C of coffee a day   Drives a truck having CDL license.  [place] radius based out of [employer] [facility]   Financial Resource Strain: Low Risk  (08/13/2023)   Overall Financial Resource Strain (CARDIA)    Difficulty of Paying Living Expenses: Not very hard  Food Insecurity: No Food Insecurity (08/13/2023)   Hunger Vital Sign    Worried About Running Out of Food in the Last Year: Never true    Ran Out of Food in the Last Year: Never true  Transportation Needs: No Transportation Needs (08/13/2023)   PRAPARE - Administrator, Civil Service (Medical): No    Lack of  Transportation (Non-Medical): No  Physical Activity: Insufficiently Active (08/13/2023)   Exercise Vital Sign    Days of Exercise per Week: 4 days    Minutes of Exercise per Session: 10 min  Stress: No Stress Concern Present (08/13/2023)   [employer] of Occupational Health - Occupational Stress Questionnaire    Feeling of Stress : Not at all  Social Connections: Unknown (08/13/2023)   Social Connection and Isolation Panel [NHANES]    Frequency of Communication with Friends and Family: Patient declined    Frequency of Social Gatherings with Friends and Family: Twice a week    Attends Religious Services: More than 4 times per year    Active Member of [employer] or Organizations: Yes    Attends Banker Meetings: More than  4 times per year    Marital Status: Married    Tobacco Counseling Counseling given: Not Answered Tobacco comments: currenty vapes (tabaco)   Clinical Intake:  Pre-visit preparation completed: Yes        Diabetes: No  How often do you need to have someone help you when you read instructions, pamphlets, or other written materials from your doctor or pharmacy?: 1 - Never  Interpreter Needed?: No  Information entered by :: Remi Haggard lPN   Activities of Daily Living    08/13/2023    1:18 PM 08/12/2023    7:42 PM  In your present state of health, do you have any difficulty performing the following activities:  Hearing? 0 0  Vision? 0 0  Difficulty concentrating or making decisions? 0 0  Walking or climbing stairs? 1 1  Dressing or bathing? 0 0  Doing errands, shopping? 0 0  Preparing Food and eating ? N N  Using the Toilet? N N  In the past six months, have you accidently leaked urine? Y Y  Do you have problems with loss of bowel control? N N  Managing your Medications? N N  Managing your Finances? N N  Housekeeping or managing your Housekeeping? N N    Patient Care Team: Shade Flood, MD as PCP - General (Family  Medicine) Marykay Lex, MD as PCP - Cardiology (Cardiology)  Indicate any recent Medical Services you may have received from other than [facility] providers in the past year (date may be approximate).     Assessment:   This is a routine wellness examination for Jarvis.  Hearing/Vision screen Hearing Screening - Comments:: Slight loss of hearing  Hearing aids Vision Screening - Comments:: Up to date No specific every other year [facility]   Goals Addressed             This Visit's Progress    Patient Stated       [place] current lifestyle       Depression Screen    08/13/2023   12:57 PM 08/13/2023    8:22 AM 12/16/2022    4:16 PM 07/25/2022    4:17 PM 11/22/2021    9:00 AM 02/09/2021    2:01 PM 07/05/2020    8:14 AM  PHQ 2/9 Scores  PHQ - 2 Score 0 0 0 0 0 0 0  PHQ- 9 Score 0 0 0        Fall Risk    08/13/2023    8:22 AM 08/12/2023    7:42 PM 12/16/2022    4:15 PM 07/25/2022    4:32 PM 07/24/2022    5:56 PM  Fall Risk   Falls in the past year? 0 0 0 0 0  Number falls in past yr: 0  0 0   Injury with Fall? 0 0 0 0   Risk for fall due to : No Fall Risks  No Fall Risks No Fall Risks   Follow up Falls evaluation completed  Falls evaluation completed Falls prevention discussed     MEDICARE RISK AT HOME: Medicare Risk at Home Any stairs in or around the home?: No If so, are there any without handrails?: No Home free of loose throw rugs in walkways, pet beds, electrical cords, etc?: Yes Adequate lighting in your home to reduce risk of falls?: Yes Life alert?: No Use of a cane, walker or w/c?: No Grab bars in the bathroom?: No Shower chair or bench in shower?: Yes Elevated toilet  seat or a handicapped toilet?: No  TIMED UP AND GO:  Was the test performed?  No    Cognitive Function:        08/13/2023   12:58 PM 07/25/2022    4:34 PM 02/09/2021    4:30 PM 01/03/2020    9:45 AM 11/02/2019    8:13 AM  6CIT Screen  What Year? 0 points 0 points 0 points 0 points 0  points  What month? 0 points 0 points 0 points 0 points 0 points  What time? 0 points 0 points 0 points 0 points 0 points  Count back from 20 0 points 0 points 0 points 0 points 0 points  Months in reverse 0 points 0 points 0 points 0 points 0 points  Repeat phrase 0 points 0 points 0 points 0 points 4 points  Total Score 0 points 0 points 0 points 0 points 4 points    Immunizations Immunization History  Administered Date(s) Administered   Influenza,inj,Quad PF,6+ Mos 09/26/2016   Influenza-Unspecified 09/13/2015    TDAP status: Due, Education has been provided regarding the importance of this vaccine. Advised may receive this vaccine at local pharmacy or Health Dept. Aware to provide a copy of the vaccination record if obtained from local pharmacy or Health Dept. Verbalized acceptance and understanding.  Flu Vaccine status: Declined, Education has been provided regarding the importance of this vaccine but patient still declined. Advised may receive this vaccine at local pharmacy or Health Dept. Aware to provide a copy of the vaccination record if obtained from local pharmacy or Health Dept. Verbalized acceptance and understanding.  Pneumococcal vaccine status: Declined,  Education has been provided regarding the importance of this vaccine but patient still declined. Advised may receive this vaccine at local pharmacy or Health Dept. Aware to provide a copy of the vaccination record if obtained from local pharmacy or Health Dept. Verbalized acceptance and understanding.   Covid-19 vaccine status: Declined, Education has been provided regarding the importance of this vaccine but patient still declined. Advised may receive this vaccine at local pharmacy or Health Dept.or vaccine clinic. Aware to provide a copy of the vaccination record if obtained from local pharmacy or Health Dept. Verbalized acceptance and understanding.  Qualifies for Shingles Vaccine? Yes   Zostavax completed No   Shingrix  Completed?: No.    Education has been provided regarding the importance of this vaccine. Patient has been advised to call insurance company to determine out of pocket expense if they have not yet received this vaccine. Advised may also receive vaccine at local pharmacy or Health Dept. Verbalized acceptance and understanding.  Screening Tests Health Maintenance  Topic Date Due   COVID-19 Vaccine (1 - 2023-24 season) 08/29/2023 (Originally 07/27/2023)   Medicare Annual Wellness (AWV)  08/12/2024   Colonoscopy  04/26/2025   HPV VACCINES  Aged Out   DTaP/Tdap/Td  Discontinued   Pneumonia Vaccine 84+ Years old  Discontinued   INFLUENZA VACCINE  Discontinued   Hepatitis C Screening  Discontinued   Zoster Vaccines- Shingrix  Discontinued    Health Maintenance  There are no preventive care reminders to display for this patient.   Colorectal cancer screening: Referral to GI placed  . Pt aware the office will call re: appt.  Lung Cancer Screening: (Low Dose CT Chest recommended if Age 23-80 years, 20 pack-year currently smoking OR have quit w/in 15years.) does not qualify.   Lung Cancer Screening Referral:   Additional Screening:  Hepatitis C Screening: Never done  Vision Screening: Recommended annual ophthalmology exams for early detection of glaucoma and other disorders of the eye. Is the patient up to date with their annual eye exam?  No  Who is the provider or what is the name of the office in which the patient attends annual eye exams? Every other year [facility] If pt is not established with a provider, would they like to be referred to a provider to establish care? No .   Dental Screening: Recommended annual dental exams for proper oral hygiene   Community Resource Referral / Chronic Care Management: CRR required this visit?  No   CCM required this visit?  No     Plan:     I have personally reviewed and noted the following in the patient's chart:   Medical and social  history Use of alcohol, tobacco or illicit drugs  Current medications and supplements including opioid prescriptions. Patient is not currently taking opioid prescriptions. Functional ability and status Nutritional status Physical activity Advanced directives List of other physicians Hospitalizations, surgeries, and ER visits in previous 12 months Vitals Screenings to include cognitive, depression, and falls Referrals and appointments  In addition, I have reviewed and discussed with patient certain preventive protocols, quality metrics, and best practice recommendations. A written personalized care plan for preventive services as well as general preventive health recommendations were provided to patient.     Remi Haggard, LPN   [id number]   After Visit Summary: (MyChart) Due to this being a telephonic visit, the after visit summary with patients personalized plan was offered to patient via MyChart   Nurse Notes:

## 2023-08-13 NOTE — Patient Instructions (Addendum)
Mr. Bruce Williams , Thank you for taking time to come for your Medicare Wellness Visit. I appreciate your ongoing commitment to your health goals. Please review the following plan we discussed and let me know if I can assist you in the future.   Screening recommendations/referrals:   Colonoscopy: Dr TRE Loetta Rough  [facility] [address] [place]  [phone]  Recommended yearly ophthalmology/optometry visit for glaucoma screening and checkup Recommended yearly dental visit for hygiene and checkup  Vaccinations: Influenza vaccine: - Pneumococcal vaccine: - Tdap vaccine: Education provided Shingles vaccine: -    Advanced directives: not on file    Preventive Care 65 Years and Older, Male Preventive care refers to lifestyle choices and visits with your health care provider that can promote health and wellness. What does preventive care include? A yearly physical exam. This is also called an annual well check. Dental exams once or twice a year. Routine eye exams. Ask your health care provider how often you should have your eyes checked. Personal lifestyle choices, including: Daily care of your teeth and gums. Regular physical activity. Eating a healthy diet. Avoiding tobacco and drug use. Limiting alcohol use. Practicing safe sex. Taking low doses of aspirin every day. Taking vitamin and mineral supplements as recommended by your health care provider. What happens during an annual well check? The services and screenings done by your health care provider during your annual well check will depend on your age, overall health, lifestyle risk factors, and family history of disease. Counseling  Your health care provider may ask you questions about your: Alcohol use. Tobacco use. Drug use. Emotional well-being. Home and relationship well-being. Sexual activity. Eating habits. History of falls. Memory and ability to understand (cognition). Work and work  Astronomer. Screening  You may have the following tests or measurements: Height, weight, and BMI. Blood pressure. Lipid and cholesterol levels. These may be checked every 5 years, or more frequently if you are over 37 years old. Skin check. Lung cancer screening. You may have this screening every year starting at age 5 if you have a 30-pack-year history of smoking and currently smoke or have quit within the past 15 years. Fecal occult blood test (FOBT) of the stool. You may have this test every year starting at age 23. Flexible sigmoidoscopy or colonoscopy. You may have a sigmoidoscopy every 5 years or a colonoscopy every 10 years starting at age 71. Prostate cancer screening. Recommendations will vary depending on your family history and other risks. Hepatitis C blood test. Hepatitis B blood test. Sexually transmitted disease (STD) testing. Diabetes screening. This is done by checking your blood sugar (glucose) after you have not eaten for a while (fasting). You may have this done every 1-3 years. Abdominal aortic aneurysm (AAA) screening. You may need this if you are a current or former smoker. Osteoporosis. You may be screened starting at age 39 if you are at high risk. Talk with your health care provider about your test results, treatment options, and if necessary, the need for more tests. Vaccines  Your health care provider may recommend certain vaccines, such as: Influenza vaccine. This is recommended every year. Tetanus, diphtheria, and acellular pertussis (Tdap, Td) vaccine. You may need a Td booster every 10 years. Zoster vaccine. You may need this after age 75. Pneumococcal 13-valent conjugate (PCV13) vaccine. One dose is recommended after age 55. Pneumococcal polysaccharide (PPSV23) vaccine. One dose is recommended after age 110. Talk to your health care provider about which  screenings and vaccines you need and how often you need them. This information is not intended to replace  advice given to you by your health care provider. Make sure you discuss any questions you have with your health care provider. Document Released: 12/08/2015 Document Revised: 07/31/2016 Document Reviewed: 09/12/2015 Elsevier Interactive Patient Education  2017 [employer].  Fall Prevention in the Home Falls can cause injuries. They can happen to people of all ages. There are many things you can do to make your home safe and to help prevent falls. What can I do on the outside of my home? Regularly fix the edges of walkways and driveways and fix any cracks. Remove anything that might make you trip as you walk through a door, such as a raised step or threshold. Trim any bushes or trees on the path to your home. Use bright outdoor lighting. Clear any walking paths of anything that might make someone trip, such as rocks or tools. Regularly check to see if handrails are loose or broken. Make sure that both sides of any steps have handrails. Any raised decks and porches should have guardrails on the edges. Have any leaves, snow, or ice cleared regularly. Use sand or salt on walking paths during winter. Clean up any spills in your garage right away. This includes oil or grease spills. What can I do in the bathroom? Use night lights. Install grab bars by the toilet and in the tub and shower. Do not use towel bars as grab bars. Use non-skid mats or decals in the tub or shower. If you need to sit down in the shower, use a plastic, non-slip stool. Keep the floor dry. Clean up any water that spills on the floor as soon as it happens. Remove soap buildup in the tub or shower regularly. Attach bath mats securely with double-sided non-slip rug tape. Do not have throw rugs and other things on the floor that can make you trip. What can I do in the bedroom? Use night lights. Make sure that you have a light by your bed that is easy to reach. Do not use any sheets or blankets that are too big for your bed.  They should not hang down onto the floor. Have a firm chair that has side arms. You can use this for support while you get dressed. Do not have throw rugs and other things on the floor that can make you trip. What can I do in the kitchen? Clean up any spills right away. Avoid walking on wet floors. Keep items that you use a lot in easy-to-reach places. If you need to reach something above you, use a strong step stool that has a grab bar. Keep electrical cords out of the way. Do not use floor polish or wax that makes floors slippery. If you must use wax, use non-skid floor wax. Do not have throw rugs and other things on the floor that can make you trip. What can I do with my stairs? Do not leave any items on the stairs. Make sure that there are handrails on both sides of the stairs and use them. Fix handrails that are broken or loose. Make sure that handrails are as long as the stairways. Check any carpeting to make sure that it is firmly attached to the stairs. Fix any carpet that is loose or worn. Avoid having throw rugs at the top or bottom of the stairs. If you do have throw rugs, attach them to the floor with carpet  tape. Make sure that you have a light switch at the top of the stairs and the bottom of the stairs. If you do not have them, ask someone to add them for you. What else can I do to help prevent falls? Wear shoes that: Do not have high heels. Have rubber bottoms. Are comfortable and fit you well. Are closed at the toe. Do not wear sandals. If you use a stepladder: Make sure that it is fully opened. Do not climb a closed stepladder. Make sure that both sides of the stepladder are locked into place. Ask someone to hold it for you, if possible. Clearly mark and make sure that you can see: Any grab bars or handrails. First and last steps. Where the edge of each step is. Use tools that help you move around (mobility aids) if they are needed. These  include: Canes. Walkers. Scooters. Crutches. Turn on the lights when you go into a dark area. Replace any light bulbs as soon as they burn out. Set up your furniture so you have a clear path. Avoid moving your furniture around. If any of your floors are uneven, fix them. If there are any pets around you, be aware of where they are. Review your medicines with your doctor. Some medicines can make you feel dizzy. This can increase your chance of falling. Ask your doctor what other things that you can do to help prevent falls. This information is not intended to replace advice given to you by your health care provider. Make sure you discuss any questions you have with your health care provider. Document Released: 09/07/2009 Document Revised: 04/18/2016 Document Reviewed: 12/16/2014 Elsevier Interactive Patient Education  2017 [employer].

## 2023-08-13 NOTE — Telephone Encounter (Signed)
Meds sent to [facility] today.

## 2023-08-13 NOTE — Progress Notes (Signed)
Subjective:  Patient ID: Bruce Williams, male    DOB: January 19, 1951  Age: 72 y.o. MRN: [id number]  CC:  Chief Complaint  Patient presents with   Medical Management of Chronic Issues    HPI Bruce Williams presents for  Follow up. No recent health changes.   Hyperlipidemia: With CAD, followed by cardiology, Dr. Herbie Baltimore.  Treated previously with Crestor and Zetia.  Seen earlier this year for some shoulder pain, stop Zetia with some improvement in the shoulder pain.  Recommended discussion of other meds or changes with cardiology.  Was changed to [facility] but this was too expensive.  Office visit July 11 with cardiology noted.  Goal of LDL 50 given history of CAD.  Depending on labs and tolerability option of alternating 20 mg and 40 mg Crestor doses or possible candidate for Repatha if unable to control levels with higher doses of statin. Has been trying high dose Krill oil past 2months, along with Crestor 20mg  every day.  Some chronic arthritis pain in R ankle, knees, no new symptoms or worsening sx';s and voltaren gel takes edge off.  Prior surgery of right ankle with hardware.  Lab Results  Component Value Date   CHOL 122 12/02/2022   HDL 54.20 12/02/2022   LDLCALC 54 12/02/2022   TRIG 70.0 12/02/2022   CHOLHDL 2 12/02/2022   Lab Results  Component Value Date   ALT 14 12/02/2022   AST 15 12/02/2022   ALKPHOS 35 (L) 12/02/2022   BILITOT 0.3 12/02/2022   Hypertension: Also discussed with cardiology recently, history of OSA on CPAP, with DOT exams every 2 years.  Plan for DOT stress test in July 2025, Lexiscan Myoview at that time.  Bisoprolol 5 mg daily.  Home readings: none.  BP Readings from Last 3 Encounters:  08/13/23 124/68  06/05/23 124/76  12/16/22 128/78   Lab Results  Component Value Date   CREATININE 1.02 12/02/2022   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/16/2021    OSA on CPAP 03/08/2021   Abnormal stress electrocardiogram test using treadmill 08/07/2020   Essential hypertension 08/31/2018   Irregular heart beats 08/31/2018   Encounter for physical examination related to employment 08/31/2018   Coronary artery disease involving native heart without angina pectoris    Hyperlipidemia with target LDL less than 70    Past Medical History:  Diagnosis Date   Coronary artery disease involving native heart without angina pectoris 1998   No angina since MI in 1998 - Non-ischemic Stress Echo 08/2017   History of acute Inferior-Posterior wall MI 1998   [place] - BMS PCI RCA (inferoposterior Akinesis on Echo) & Large Sized - Severe Intesity Fixed Perfusion Defect in Basal-mid-apical Inferior-inferolateral & apical lateral wall c/w prior Infarct (no peri-infarct ischemia)   Hyperlipidemia with target low density lipoprotein (LDL) cholesterol less than 70 mg/dL    OSA on CPAP    Sleep apnea    Phreesia 02/06/2021   Past Surgical History:  Procedure Laterality Date   ANKLE FRACTURE SURGERY Right 1980's   CORONARY/GRAFT ACUTE MI REVASCULARIZATION  1990   BMS PCI to RCA (in [place]) in setting of inferoposterior STEMI   EXERCISE TOLERANCE TEST  08/01/2020   Exercised 3:36 minutes-  Peak HR 137 bpm, peak BP 225/77 mmHg.  MPHR 153 bpm-achieved 89%.  5.3 METS.  Nondiagnostic EKG stress test for ischemia due to resting ST or T wave abnormalities.  Poor functional capacity, with hypertensive response to exercise.  Read As Intermediate Risk   NM MYOVIEW LTD  08/30/2020   EF 45 to 50% with mild diffuse HK.  Large size/severe defect in the basal-mid-apical inferior-inferolateral and apical lateral wall.  Consistent with prior MI in the inferolateral distribution.  No reversibility.  Infarct with no ischemia. => Intermediate Risk because of old infarct and mildly reduced EF, but no evidence of ischemia.   TREADMILL STRESS ECHO  09/17/2017   Pre-stress normal LV function at  rest but no inferior posterior akinesis..  Exercised for: 35 min.  7.4 METS --> fair functional capacity.  Inferior posterior wall akinesis at rest consistent with prior MI --> maintained inferior posterior akinesis with exertion.  No evidence of ischemia.   Allergies  Allergen Reactions   Zetia [Ezetimibe] Other (See Comments)    Muscle aches and joint pain - stopped   Prior to Admission medications   Medication Sig Start Date End Date Taking? Authorizing Provider  aspirin EC 81 MG tablet Take 81 mg by mouth daily.   Yes [provider]  Bempedoic Acid (NEXLETOL) 180 MG TABS Take 1 tablet (180 mg total) by mouth daily. 05/07/23  Yes Marykay Lex, MD  bisoprolol (ZEBETA) 5 MG tablet Take 1 tablet by mouth once daily 07/18/23  Yes Shade Flood, MD  co-enzyme Q-10 30 MG capsule Take 30 mg by mouth 3 (three) times daily.   Yes [provider]  gabapentin (NEURONTIN) 100 MG capsule Take 1 capsule (100 mg total) by mouth 3 (three) times daily as needed. 08/07/22  Yes Lomax, Amy, NP  ketoconazole (NIZORAL) 200 MG tablet Take 2 tablets (400 mg total) by mouth daily. 07/20/21  Yes Shade Flood, MD  Multiple Vitamin (MULTI-VITAMINS) TABS Take by mouth.   Yes [provider]  Probiotic Product (PROBIOTIC MATURE ADULT PO) Take 94 mg by mouth.   Yes [provider]  rosuvastatin (CRESTOR) 20 MG tablet Take 1 tablet by mouth once daily 07/18/23  Yes Shade Flood, MD  triamcinolone cream (KENALOG) 0.1 % Apply to patches of dry skin 2 times daily as needed 12/16/22  Yes Shade Flood, MD  TURMERIC PO Take by mouth.   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     He lives with his current wife of 9 years.   R handed   Caffeine: 2 C of coffee a day   Drives a truck having CDL license.  [place] a mile radius based out of [employer] [facility] [employer]  Financial Resource Strain: Low Risk  (08/12/2023)   Overall Financial Resource Strain (CARDIA)    Difficulty of Paying Living Expenses: Not very hard  Food Insecurity: No Food Insecurity (08/12/2023)   Hunger Vital Sign    Worried About Running Out of Food in the Last Year: Never true    Ran Out of Food in the Last Year: Never true  Transportation Needs: No Transportation Needs (08/12/2023)   PRAPARE - Administrator, Civil Service (Medical): No    Lack of Transportation (Non-Medical): No  Physical Activity: Insufficiently Active (08/12/2023)   Exercise Vital Sign    Days of Exercise per Week: 4 days    Minutes of Exercise per Session: 10 min  Stress: No Stress Concern Present (08/12/2023)   [employer] of Occupational Health - Occupational Stress Questionnaire    Feeling of Stress : Not at all  Social Connections: Unknown (08/12/2023)   Social Connection and Isolation Panel [NHANES]    Frequency of Communication with Friends and Family: Patient declined    Frequency of Social Gatherings with Friends and Family: Twice a week    Attends Religious Services: More than 4 times per year    Active Member of [employer] or Organizations: Yes    Attends Engineer, structural: More than 4 times  per year    Marital Status: Married  Catering manager Violence: Not At Risk (07/25/2022)   Humiliation, Afraid, Rape, and Kick questionnaire    Fear of Current or Ex-Partner: No    Emotionally Abused: No    Physically Abused: No    Sexually Abused: No    Review of Systems  Constitutional:  Negative for fatigue and unexpected weight change.  Eyes:  Negative for visual disturbance.  Respiratory:  Negative for cough, chest tightness and shortness of breath.   Cardiovascular:  Negative for chest pain, palpitations and leg swelling.  Gastrointestinal:  Negative for abdominal pain and blood in stool.  Neurological:  Negative for dizziness, light-headedness and headaches.     Objective:   Vitals:   08/13/23 0816  BP: 124/68  Pulse: 75  Temp: 97.8 F (36.6 C)  TempSrc: Temporal  SpO2: 95%  Weight: (!) 332 lb 9.6 oz (150.9 kg)     Physical Exam Vitals reviewed.  Constitutional:      Appearance: He is well-developed.  HENT:     Head: Normocephalic and atraumatic.  Neck:     Vascular: No carotid bruit or JVD.  Cardiovascular:     Rate and Rhythm: Normal rate and regular rhythm.     Heart sounds: Normal heart sounds. No murmur heard. Pulmonary:     Effort: Pulmonary effort is normal.     Breath sounds: Normal breath sounds. No rales.  Musculoskeletal:     Right lower leg: No edema.     Left lower leg: No edema.  Skin:    General: Skin is warm and dry.  Neurological:     Mental Status: He is alert and oriented to person, place, and time.  Psychiatric:        Mood and Affect: Mood normal.    Assessment & Plan:  Bruce Williams is a 72 y.o. male . Hyperlipidemia, unspecified hyperlipidemia type - Plan: Comprehensive metabolic panel, Lipid panel, rosuvastatin (CRESTOR) 20 MG tablet  -Check lipids today and decide on alternating 20/40 mg doses of Crestor, depending on tolerability, or potential need for med like Repatha.  Goal LDL less than 50 with history of  CAD.  Arthralgia of right ankle  -Chronic, denies new symptoms or significant debility, treated with over-the-counter Voltaren which is reasonable for now.  RTC precautions if worsening symptoms or more difficulty with other joint pain.  Unlikely statin cause.  Essential hypertension - Plan: Comprehensive metabolic panel  -Stable with current doses of beta, continue same.  Continue CPAP for OSA which reports controlled symptoms.  Coronary artery disease involving native heart without angina pectoris, unspecified vessel or lesion type - Plan: bisoprolol (ZEBETA) 5 MG tablet, rosuvastatin (CRESTOR) 20 MG tablet  -Asymptomatic, recent cardiology eval and plan for lipid-lowering therapy as above.  No orders of the defined types were placed in this encounter.  Patient Instructions  No medication changes at this time.  Depending on your cholesterol levels we can discuss whether to do the higher dose of statin, alternating higher and current dose of statin, or meeting with cardiology to discuss statin medication like Repatha.  Again we can discuss this once we see your levels.  Blood pressure stable.  Thanks for coming in today and take care.     Signed,   Meredith Staggers, MD [facility], [facility] 08/13/23 8:47 AM

## 2023-08-19 ENCOUNTER — Encounter: Payer: Self-pay | Admitting: Family Medicine

## 2023-08-19 ENCOUNTER — Ambulatory Visit (INDEPENDENT_AMBULATORY_CARE_PROVIDER_SITE_OTHER): Payer: Medicare Other | Admitting: Family Medicine

## 2023-08-19 VITALS — BP 144/75 | HR 75 | Ht 71.0 in | Wt 332.5 lb

## 2023-08-19 DIAGNOSIS — M792 Neuralgia and neuritis, unspecified: Secondary | ICD-10-CM | POA: Diagnosis not present

## 2023-08-19 DIAGNOSIS — G4733 Obstructive sleep apnea (adult) (pediatric): Secondary | ICD-10-CM

## 2023-08-19 MED ORDER — GABAPENTIN 100 MG PO CAPS: 100.0000 mg | ORAL_CAPSULE | Freq: Three times a day (TID) | ORAL | 3 refills | Status: DC | PRN

## 2023-08-19 NOTE — Progress Notes (Signed)
PATIENT: Bruce Williams DOB: September 14, 1951  REASON FOR VISIT: follow up HISTORY FROM: patient  Chief Complaint  Patient presents with   Room 1    Pt is here Alone. Pt states that it's one of those love hate relationships with his CPAP. Pt states that everything is good with his CPAP and he doesn't have any new questions or concerns about his CPAP Machine.      HISTORY OF PRESENT ILLNESS:  08/19/23 ALL:  Bruce Williams returns for follow up for OSA on CPAP and peripheral neuropathy. He continues gabapentin 300mg  daily. Neuropathy is stable.   He continues to do well on therapy. He is using CPAP nightly for about 6-7 hours, on average. He denies concerns with machine or supplies. He is using a nasal mask.      08/07/2022 ALL: Bruce Williams returns for follow up for OSA on CPAP and recently diagnosed peripheral neuropathy.   He is doing well on CPAP. He likes his new machine. He is using therapy nightly. He is using nasal pillow. He does not note air leaking.   He was started on gabapentin 100mg  up to three times daily. He took ALA for about a month and felt that it was not effective and was too expensive. He continues gabapentin 100mg  three times daily but considering taking 300mg  at bedtime.     01/31/2022 CD: Bruce Williams is a 72 y.o. male patient, seen here in a referral from Dr. Neva Seat for referral for foot numbness and lower back pain radiating down R leg.  Radiating outside of the leg into the toes. Across dorsum pedis between hallux and second toe.    Requesting nerve conduction testing to determine neuropathy cause. Neck pain and burning at base of neck. No recent injuries.    He is not a drinker, vapes , former smoker.    TSH, Vit B 12 , lipids, electrolytes and CMET were normal 09-2021. He reports arthritis pain, takes ibuprofen.   08/06/2021 ALL: Bruce Williams is a 72 y.o. male here today for follow up for OSA on CPAP.  He received new CPAP 05/2021. He is doing well. He reports nightly use.  He does note air leak when sleeping on his side. He has tried multiple mask and most comfortable with medium nasal pillow. He is frustrated with DME as he report last three months of data is not available to him on [facility]. We are able to pull most recent data. 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      HISTORY: (copied from Dr Dohmeier's previous note)  HPI:  Bruce Williams is a 72 y.o. male patient, seen here in a referral from Dr. Neva Seat for a sleep apnea evaluation-  I have the pleasure of meeting Bruce Williams last time in September 2019 at this time he already was a CPAP user and old the current machine since 2016.  He has been 100% compliant CPAP user 6 hours 41 minutes on average his minimum pressure setting is 5 maximum pressure setting 17 cmH2O with a 3 cm expiratory pressure relief.  His 95th percentile pressure is 14.3 cm so he is well within his current settings.  The residual AHI is 2.2 and there are no Cheyne-Stokes respirations measured.  A CPAP compliance report is 100% compliant CPAP compliance reports do not show the current baseline of apnea.  Hypoxemia.  His BMI has been 45 and has been in that range for a long time.  So I like  for him to write a new prescription for his supplies, I also would order at least a home sleep test to have a new baseline and based on his current apnea count I would set the new machine probably similar to the old one.  The patient is okay with this approach.    08-04-2018 He has been a DOT driver -  [facility] now [facility] patient , with known OSA and CPAP treatment.  Bruce Williams is a 72 year old married Caucasian right-handed patient who is a Electronics engineer and specialized in aluminum. He was first diagnosed in 2002 at [facility] in hospital sleep lab.  He learned that he had sleep apnea and was treated with CPAP ever since.  Once he moved to [place] he continued treatment here at [facility].  He  has been using an AutoSet air sense 10 machine, and this was just issued to him in May 2016.  It is set between 5 and 15 cmH2O with 3 cm EPR, he is using on average 6 hours and 35 minutes of CPAP therapy nightly, 100% compliance by download and his residual AHI is 1.8.  He does however have high air leakage, the 95th percentile pressure is close to 14 cm which would mean that he could use a little bit higher pressure settings on his AutoSet.  He feels clinically that his CPAP therapy is sufficient he has not endorsed elevated sleepiness levels on the Epworth sleepiness score.  Today he endorses at 4 points, fatigue severity at 23 points, and the geriatric depression score was endorsed at 1 out of 15 points.   DOT related 90-day download shows equally 100% of use at time 99% by time over 4 hours, only one night to use the machine for 3-1/2 hours.  Average use of time 6 hours 26 minutes, same settings as above, residual AHI 2.0 with obstructive apneas being 0.5.  Similar air leak.   Chief complaint according to patient :He needs to transfer care and wanted to be in the [facility].  Bruce Williams has been a compliant CPAP user for well over 15 years, and he has been followed at [facility] in [facility] for the last 3 or 5 years.  That needs to be no change in settings, his current machine is 70.70 years old but I like for him to have a new nasal pillow fitted.  The ResMed air fit P 10 is very easily sliding off and he may do better with an N 30 I. I will follow him yearly until he needs a new machine.    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  He works about 10 to 12 hours daily in his retirement job.  He just transitioned to Medicare.  Occasional he will spend the evening at church functions, the couple goes bowling, movies goes to the movies but he is not necessarily physically active. His bedtime is  usually between 10 and 11 PM unless he has a very early morning assignment.  He usually has no trouble to initiate sleep, on occasion he will be early in bed because of the early morning assignments, but he usually sleeps well in a cool, quiet and dark bedroom.  He does not have nocturia.  He sleeps on either side, mostly mostly the left. He sleeps on only one pillow for head support in a nonadjustable bed.  He rises in the morning usually at 6 AM or 7 AM, and he usually feels rested at the time.  He averages 7 to 8 hours of nocturnal sleep, most nights uninterrupted.   I have the pleasure of reviewing Bruce Williams's last sleep study which was a home sleep test performed at [facility] on [address] in [place] on Mar 30, 2015 by Dr. at bedtime Mohammed.  The AHI was 57.6, supine sleep position accentuated the AHI to 59.3, the majority was obstructive 28.5/h followed by centrals at 14.4/h.  Oxygen saturation low was 79%, duration of 31 minutes at or below 88%.  1 hour 49 minutes.  Pulse rate between 47 and 94 bpm apparently no arrhythmia was captured.  He was issued the auto set air sense 10 machine after this test.  I also have a pulmonology note available obstructive sleep apnea ordering AutoPap 5 through 15 cmH2O, risk factor BMI 45-49.9 in adult.  Based on these notes I should be able to transfer his CPAP care and supplies to a local DME.   Sleep medical history and family sleep history: CAD, MI in 1998 and stent placement in [place]. - reports ongoing  irregular heart beats.  OSA affected his father, another air force veteran. No sleep walking, enuresis , no night terrors.    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 The patient is married, has 3 adult children ( oldest 16 year old daughter with 3 children, 2 step children) , 5 grandchildren- former 56 year smoker- quit 5 years ago in 2014. Beer / wine - one glass a week. Caffeine ; 2 cups of  coffee a day- no sodas, not often iced tea.    REVIEW OF SYSTEMS: Out of a complete 14 system review of symptoms, the patient complains only of the following symptoms, burning pain in feet and all other reviewed systems are negative.  ESS: 3/24, previously 2  ALLERGIES: Allergies  Allergen Reactions   Zetia [Ezetimibe] Other (See Comments)    Muscle aches and joint pain - stopped    HOME MEDICATIONS: Outpatient Medications Prior to Visit  Medication Sig Dispense Refill   aspirin EC 81 MG tablet Take 81 mg by mouth daily.     bisoprolol (ZEBETA) 5 MG tablet Take 1 tablet (5 mg total) by mouth daily. 90 tablet 2   co-enzyme Q-10 30 MG capsule Take 30 mg by mouth 3 (three) times daily.     ketoconazole (NIZORAL) 200 MG tablet Take 2 tablets (400 mg total) by mouth daily. 2 tablet 1   Multiple Vitamin (MULTI-VITAMINS) TABS Take by mouth.     Probiotic Product (PROBIOTIC MATURE ADULT PO) Take 94 mg by mouth.     rosuvastatin (CRESTOR) 20 MG tablet Take 1 tablet (20 mg total) by mouth daily. 90 tablet 2   triamcinolone cream (KENALOG) 0.1 % Apply to patches of dry skin 2 times daily as needed 30 g 1   TURMERIC PO Take by mouth.     gabapentin (NEURONTIN) 100 MG capsule Take 1 capsule (100 mg total) by mouth 3 (three) times daily as needed. 270 capsule 3   No facility-administered medications prior to visit.    PAST MEDICAL HISTORY: Past Medical History:  Diagnosis Date   Coronary artery disease involving native heart without angina pectoris 1998   No angina since MI in 1998 - Non-ischemic Stress Echo 08/2017   History of acute Inferior-Posterior wall MI 1998   (  [place]) - BMS PCI RCA (inferoposterior Akinesis on Echo) & Large Sized - Severe Intesity Fixed Perfusion Defect in Basal-mid-apical Inferior-inferolateral & apical lateral wall c/w prior Infarct (no peri-infarct ischemia)   Hyperlipidemia with target low density lipoprotein (LDL) cholesterol less than 70 mg/dL    OSA on CPAP     Sleep apnea    Phreesia 02/06/2021    PAST SURGICAL HISTORY: Past Surgical History:  Procedure Laterality Date   ANKLE FRACTURE SURGERY Right 1980's   CORONARY/GRAFT ACUTE MI REVASCULARIZATION  1990   BMS PCI to RCA (in [place]) in setting of inferoposterior STEMI   EXERCISE TOLERANCE TEST  08/01/2020   Exercised 3:36 minutes-  Peak HR 137 bpm, peak BP 225/77 mmHg.  MPHR 153 bpm-achieved 89%.  5.3 METS.  Nondiagnostic EKG stress test for ischemia due to resting ST or T wave abnormalities.  Poor functional capacity, with hypertensive response to exercise.  Read As Intermediate Risk   NM MYOVIEW LTD  08/30/2020   EF 45 to 50% with mild diffuse HK.  Large size/severe defect in the basal-mid-apical inferior-inferolateral and apical lateral wall.  Consistent with prior MI in the inferolateral distribution.  No reversibility.  Infarct with no ischemia. => Intermediate Risk because of old infarct and mildly reduced EF, but no evidence of ischemia.   TREADMILL STRESS ECHO  09/17/2017   Pre-stress normal LV function at rest but no inferior posterior akinesis..  Exercised for: 35 min.  7.4 METS --> fair functional capacity.  Inferior posterior wall akinesis at rest consistent with prior MI --> maintained inferior posterior akinesis with exertion.  No evidence of ischemia.    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     He lives with his current wife of 9 years.   R handed   Caffeine: 2 C of coffee a day   Drives a truck having CDL license.  [place] a mile radius based out of [employer] [facility]   Financial Resource Strain: Low Risk  (08/13/2023)   Overall Financial Resource Strain (CARDIA)    Difficulty of Paying Living Expenses: Not very hard  Food Insecurity: No Food Insecurity (08/13/2023)   Hunger Vital Sign    Worried About Running Out of Food in the Last Year: Never true    Ran Out of Food in the Last Year: Never true  Transportation Needs: No Transportation Needs (08/13/2023)  PRAPARE - Administrator, Civil Service (Medical): No    Lack of Transportation (Non-Medical): No  Physical Activity: Insufficiently Active (08/13/2023)   Exercise Vital Sign    Days of Exercise per Week: 4 days    Minutes of Exercise per Session: 10 min  Stress: No Stress Concern Present (08/13/2023)   [employer] of Occupational Health - Occupational Stress Questionnaire    Feeling of Stress : Not at all  Social Connections: Unknown (08/13/2023)   Social Connection and Isolation Panel [NHANES]    Frequency of Communication with Friends and Family: Patient declined    Frequency of Social Gatherings with Friends and Family: Twice a week    Attends Religious Services: More than 4 times per year    Active Member of [employer] or Organizations: Yes    Attends Banker Meetings: More than 4 times per year    Marital Status: Married  Catering manager Violence: Not At Risk (08/13/2023)   Humiliation, Afraid, Rape, and Kick questionnaire    Fear of Current or Ex-Partner:  No    Emotionally Abused: No    Physically Abused: No    Sexually Abused: No     PHYSICAL EXAM  Vitals:   08/19/23 0814  BP: (!) 144/75  Pulse: 75  Weight: (!) 332 lb 8 oz (150.8 kg)  Height: 5\' 11"  (1.803 m)     Body mass index is 46.37 kg/m.  Generalized: Well developed, in no acute distress  Cardiology: normal rate and rhythm, no murmur noted Respiratory: clear to auscultation bilaterally  Neurological examination  Mentation: Alert oriented to time, place, history taking. Follows all commands speech and language fluent Cranial nerve II-XII: Pupils were equal round reactive to light. Extraocular movements were full, visual field were full  Motor: The motor testing reveals 5 over 5 strength of all 4 extremities. Good symmetric motor tone is noted throughout.  Gait and station: Gait is normal.    DIAGNOSTIC DATA (LABS, IMAGING, TESTING) - I reviewed patient records, labs, notes, testing and imaging myself where available.      No data to display           No results found for: "WBC", "HGB", "HCT", "MCV", "PLT"    Component Value Date/Time   NA 138 08/13/2023 0853   NA 140 03/08/2021 1006   K 4.4 08/13/2023 0853   CL 105 08/13/2023 0853   CO2 27 08/13/2023 0853   GLUCOSE 93 08/13/2023 0853   BUN 15 08/13/2023 0853   BUN 19 03/08/2021 1006   CREATININE 0.96 08/13/2023 0853   CALCIUM 9.4 08/13/2023 0853   PROT 7.5 08/13/2023 0853   PROT 7.0 03/08/2021 1006   ALBUMIN 4.0 08/13/2023 0853   ALBUMIN 4.2 03/08/2021 1006   AST 16 08/13/2023 0853   ALT 14 08/13/2023 0853   ALKPHOS 42 08/13/2023 0853   BILITOT 0.5 08/13/2023 0853   BILITOT 0.4 03/08/2021 1006   GFRNONAA 85 06/28/2020 0808   GFRAA 98 06/28/2020 0808   Lab Results  Component Value Date   CHOL 136 08/13/2023   HDL 61.20 08/13/2023   LDLCALC 62 08/13/2023   TRIG 65.0 08/13/2023   CHOLHDL 2 08/13/2023   No results found for: "HGBA1C" Lab Results  Component Value Date   VITAMINB12 421  10/22/2021   Lab Results  Component Value Date   TSH 1.86 10/22/2021     ASSESSMENT AND PLAN 72 y.o. year old male  has a past medical history of Coronary  artery disease involving native heart without angina pectoris (1998), History of acute Inferior-Posterior wall MI (1998), Hyperlipidemia with target low density lipoprotein (LDL) cholesterol less than 70 mg/dL, OSA on CPAP, and Sleep apnea. here with     ICD-10-CM   1. OSA on CPAP  G47.33 For home use only DME continuous positive airway pressure (CPAP)    2. Peripheral neuropathic pain  M79.2       Kenyon Cramer is doing well on CPAP therapy. Compliance report reveals excellent compliance. He was encouraged to continue using CPAP nightly and for greater than 4 hours each night. He will continue to monitor for elevated leak at home. AHI is well managed. We will update supply orders as indicated. Risks of untreated sleep apnea review and education materials provided. He will continue gabapentin up to 300mg  daily. May add ALA if needed. Healthy lifestyle habits encouraged. He will follow up in 1 year, sooner if needed. He verbalizes understanding and agreement with this plan.   Last CPAP set up date 05/2021.    Orders Placed This Encounter  Procedures   For home use only DME continuous positive airway pressure (CPAP)    Supplies    Order Specific Question:   Length of Need    Answer:   Lifetime    Order Specific Question:   Patient has OSA or probable OSA    Answer:   Yes    Order Specific Question:   Is the patient currently using CPAP in the home    Answer:   Yes    Order Specific Question:   Settings    Answer:   Other see comments    Order Specific Question:   CPAP supplies needed    Answer:   Mask, headgear, cushions, filters, heated tubing and water chamber      Meds ordered this encounter  Medications   gabapentin (NEURONTIN) 100 MG capsule    Sig: Take 1 capsule (100 mg total) by mouth 3 (three) times daily as needed.     Dispense:  270 capsule    Refill:  3    Order Specific Question:   Supervising Provider    Answer:   Anson Fret [[id number]]     FUX NATFT, FNP-C 08/19/2023, 8:31 AM [facility] [address] [place], [place] [address] [phone]

## 2023-08-19 NOTE — Patient Instructions (Signed)
Please continue using your CPAP regularly. While your insurance requires that you use CPAP at least 4 hours each night on 70% of the nights, I recommend, that you not skip any nights and use it throughout the night if you can. Getting used to CPAP and staying with the treatment long term does take time and patience and discipline. Untreated obstructive sleep apnea when it is moderate to severe can have an adverse impact on cardiovascular health and raise her risk for heart disease, arrhythmias, hypertension, congestive heart failure, stroke and diabetes. Untreated obstructive sleep apnea causes sleep disruption, nonrestorative sleep, and sleep deprivation. This can have an impact on your day to day functioning and cause daytime sleepiness and impairment of cognitive function, memory loss, mood disturbance, and problems focussing. Using CPAP regularly can improve these symptoms.  We will update supply orders, today. Continue gabapentin up to 300mg  daily.   Follow up in 1 year

## 2023-10-16 ENCOUNTER — Other Ambulatory Visit: Payer: Self-pay | Admitting: Family Medicine

## 2023-10-16 DIAGNOSIS — I2510 Atherosclerotic heart disease of native coronary artery without angina pectoris: Secondary | ICD-10-CM

## 2023-10-16 DIAGNOSIS — E785 Hyperlipidemia, unspecified: Secondary | ICD-10-CM

## 2024-02-11 ENCOUNTER — Ambulatory Visit (INDEPENDENT_AMBULATORY_CARE_PROVIDER_SITE_OTHER): Payer: Medicare Other | Admitting: Family Medicine

## 2024-02-11 ENCOUNTER — Encounter: Payer: Self-pay | Admitting: Family Medicine

## 2024-02-11 VITALS — BP 130/76 | HR 82 | Temp 97.9°F | Ht 71.0 in | Wt 331.4 lb

## 2024-02-11 DIAGNOSIS — I2510 Atherosclerotic heart disease of native coronary artery without angina pectoris: Secondary | ICD-10-CM | POA: Diagnosis not present

## 2024-02-11 DIAGNOSIS — E785 Hyperlipidemia, unspecified: Secondary | ICD-10-CM | POA: Diagnosis not present

## 2024-02-11 DIAGNOSIS — G4733 Obstructive sleep apnea (adult) (pediatric): Secondary | ICD-10-CM | POA: Diagnosis not present

## 2024-02-11 DIAGNOSIS — I10 Essential (primary) hypertension: Secondary | ICD-10-CM | POA: Diagnosis not present

## 2024-02-11 DIAGNOSIS — G6289 Other specified polyneuropathies: Secondary | ICD-10-CM | POA: Diagnosis not present

## 2024-02-11 LAB — COMPREHENSIVE METABOLIC PANEL
ALT: 13 U/L (ref 0–53)
AST: 15 U/L (ref 0–37)
Albumin: 4.3 g/dL (ref 3.5–5.2)
Alkaline Phosphatase: 40 U/L (ref 39–117)
BUN: 17 mg/dL (ref 6–23)
CO2: 27 meq/L (ref 19–32)
Calcium: 9.6 mg/dL (ref 8.4–10.5)
Chloride: 105 meq/L (ref 96–112)
Creatinine, Ser: 0.95 mg/dL (ref 0.40–1.50)
GFR: 80.83 mL/min (ref >60.00)
Glucose, Bld: 101 mg/dL — ABNORMAL HIGH (ref 70–99)
Potassium: 4.7 meq/L (ref 3.5–5.1)
Sodium: 139 meq/L (ref 135–145)
Total Bilirubin: 0.4 mg/dL (ref 0.2–1.2)
Total Protein: 7.9 g/dL (ref 6.0–8.3)

## 2024-02-11 LAB — LIPID PANEL
Cholesterol: 130 mg/dL (ref 0–200)
HDL: 57.8 mg/dL (ref >39.00)
LDL Cholesterol: 64 mg/dL (ref 0–99)
NonHDL: 72.45
Total CHOL/HDL Ratio: 2
Triglycerides: 43 mg/dL (ref 0.0–149.0)
VLDL: 8.6 mg/dL (ref 0.0–40.0)

## 2024-02-11 MED ORDER — GABAPENTIN 100 MG PO CAPS: 100.0000 mg | ORAL_CAPSULE | Freq: Three times a day (TID) | ORAL | 1 refills | Status: DC | PRN

## 2024-02-11 MED ORDER — BISOPROLOL FUMARATE 5 MG PO TABS: 5.0000 mg | ORAL_TABLET | Freq: Every day | ORAL | 2 refills | Status: DC

## 2024-02-11 NOTE — Patient Instructions (Addendum)
 Try increasing the gabapentin to 300mg  in the morning, 100mg  in afternoon with lunch (pack with lunch), then 100mg  at bedtime. We can increase dosing weekly until better control of symptoms. Increase no more than 100mg  per dose per week. Follow up with neuro as planned.   Depending on labs we can discuss changes in cholesterol med as recommended by cardiology.   No change in bisoprolol for now.   Take care!

## 2024-02-11 NOTE — Progress Notes (Signed)
 Subjective:  Patient ID: Bruce Williams, male    DOB: [birth year]  Age: 73 y.o. MRN: [id number]  CC:  Chief Complaint  Patient presents with   Medical Management of Chronic Issues    Pt is doing well no concerns at this time, notes cardiologist and difficulty afording ezitamib and has instead been taking krill oil pt wondering oif we can test cholesterol today    Health Maintenance    Patient has not had COVID-19 Vaccines in the last year and states he will not be getting any in the future     HPI Bruce Williams presents for   Hyperlipidemia: With history of CAD, remote inferior posterior wall MI.  OSA on CPAP.   Cardiology note from July of last year reviewed.  EKG at that time with normal sinus rhythm, inferior infarct. Bare-metal stent to the RCA in 1998.  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  Plan for [facility].  Intolerant to Zetia with muscle aches and pains, treated with rosuvastatin 20 mg daily.  Nexletol was cost prohibitive.  Over-the-counter fatty acids, fish oil supplement planned.  Option of alternating higher doses of rosuvastatin, or Repatha if unable to achieve cholesterol goals with higher doses of statin.  LDL 62 in September.  He is taking rosuvastatin 20mg  every day and krill oil.  Goal of under 50 for LDL.   Lab Results  Component Value Date   CHOL 136 08/13/2023   HDL 61.20 08/13/2023   LDLCALC 62 08/13/2023   TRIG 65.0 08/13/2023   CHOLHDL 2 08/13/2023   Lab Results  Component Value Date   ALT 14 08/13/2023   AST 16 08/13/2023   ALKPHOS 42 08/13/2023   BILITOT 0.5 08/13/2023   Hypertension: Bisoprolol 5 mg daily. No new side effects.  Home readings:120-125/60-80/.  BP Readings from Last 3 Encounters:  02/11/24 130/76  08/19/23 (!) 144/75  08/13/23 124/68   Lab Results  Component Value Date   CREATININE 0.96 08/13/2023   OSA on CPAP Note from sleep specialist reviewed from September.  Excellent compliance at that time.   Continued CPAP, and gabapentin up to 300 mg daily for neuropathy.  Stable at that visit.  Neuropathy  Gabapentin - 300mg  in morning - some burning, pain during day, up legs and into lower back. DDD spine, prior NCV - early peripheral sensory neuropathy. Thought to be lumbar cause of neuropathy. Not interested in surgery at this time, some progression of symptoms over time, persistent pain during day.  No side effects with gabapentin. 300mg  in am. Inconvenient to take during the day.  Slight lessening of sx's with gabapentin. Has d/w neuro/sleep specialist.   Obesity: Walking 3 days per week. 10-20 min.  Fast food - none No soda/sweet tea. Rare snacks.   Wt Readings from Last 3 Encounters:  02/11/24 (!) 331 lb 6.4 oz (150.3 kg)  08/19/23 (!) 332 lb 8 oz (150.8 kg)  08/13/23 (!) 332 lb 9.6 oz (150.9 kg)  No results found for: "HGBA1C"     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/16/2021   OSA on CPAP 03/08/2021   Abnormal stress electrocardiogram test using treadmill 08/07/2020   Irregular heart beats 08/31/2018   Encounter for physical examination related to employment 08/31/2018   Coronary artery disease involving native heart without angina pectoris    Hyperlipidemia with target LDL less than 70  Acute bilateral low back pain 09/23/2017   Intrinsic eczema 07/21/2017   Tinea versicolor 07/21/2017   Chronic pain of right ankle 03/28/2017   Normocytic anemia 03/28/2017   Old myocardial infarction 03/28/2017   Bilateral hand pain 03/07/2016   Morbid obesity with body mass index (BMI) of 45.0 to 49.9 in adult [facility]) 03/07/2016   PSVT (paroxysmal supraventricular tachycardia) (HCC) 03/07/2016   Coronary artery disease 03/07/2016   Hyperlipidemia 03/07/2016   Past Medical History:  Diagnosis Date   Coronary artery disease involving native heart without angina pectoris 1998   No  angina since MI in 1998 - Non-ischemic Stress Echo 08/2017   History of acute Inferior-Posterior wall MI 1998   [place] - BMS PCI RCA (inferoposterior Akinesis on Echo) & Large Sized - Severe Intesity Fixed Perfusion Defect in Basal-mid-apical Inferior-inferolateral & apical lateral wall c/w prior Infarct (no peri-infarct ischemia)   Hyperlipidemia with target low density lipoprotein (LDL) cholesterol less than 70 mg/dL    OSA on CPAP    Sleep apnea    Phreesia 02/06/2021   Past Surgical History:  Procedure Laterality Date   ANKLE FRACTURE SURGERY Right 1980's   CORONARY/GRAFT ACUTE MI REVASCULARIZATION  1990   BMS PCI to RCA (in [place]) in setting of inferoposterior STEMI   EXERCISE TOLERANCE TEST  08/01/2020   Exercised 3:36 minutes-  Peak HR 137 bpm, peak BP 225/77 mmHg.  MPHR 153 bpm-achieved 89%.  5.3 METS.  Nondiagnostic EKG stress test for ischemia due to resting ST or T wave abnormalities.  Poor functional capacity, with hypertensive response to exercise.  Read As Intermediate Risk   NM MYOVIEW LTD  08/30/2020   EF 45 to 50% with mild diffuse HK.  Large size/severe defect in the basal-mid-apical inferior-inferolateral and apical lateral wall.  Consistent with prior MI in the inferolateral distribution.  No reversibility.  Infarct with no ischemia. => Intermediate Risk because of old infarct and mildly reduced EF, but no evidence of ischemia.   TREADMILL STRESS ECHO  09/17/2017   Pre-stress normal LV function at rest but no inferior posterior akinesis..  Exercised for: 35 min.  7.4 METS --> fair functional capacity.  Inferior posterior wall akinesis at rest consistent with prior MI --> maintained inferior posterior akinesis with exertion.  No evidence of ischemia.   Allergies  Allergen Reactions   Zetia [Ezetimibe] Other (See Comments)    Muscle aches and joint pain - stopped   Prior to Admission medications   Medication Sig Start Date End Date Taking? Authorizing Provider  aspirin  EC 81 MG tablet Take 81 mg by mouth daily.   Yes [provider]  bisoprolol (ZEBETA) 5 MG tablet Take 1 tablet by mouth once daily 10/16/23  Yes Shade Flood, MD  co-enzyme Q-10 30 MG capsule Take 30 mg by mouth 3 (three) times daily.   Yes [provider]  gabapentin (NEURONTIN) 100 MG capsule Take 1 capsule (100 mg total) by mouth 3 (three) times daily as needed. 08/19/23  Yes Lomax, Amy, NP  ketoconazole (NIZORAL) 200 MG tablet Take 2 tablets (400 mg total) by mouth daily. 07/20/21  Yes Shade Flood, MD  Krill Oil 300 MG CAPS Take by mouth.   Yes [provider]  Multiple Vitamin (MULTI-VITAMINS) TABS Take by mouth.   Yes [provider]  Probiotic Product (PROBIOTIC MATURE ADULT PO) Take 94 mg by mouth.   Yes [provider]  triamcinolone cream (KENALOG) 0.1 % Apply to patches of  dry skin 2 times daily as needed 12/16/22  Yes Shade Flood, MD  TURMERIC PO Take by mouth.   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      He lives with his current wife of 9 years.   R handed   Caffeine: 2 C of coffee a day   Drives a truck having CDL license.  [place] radius based out of [place]   Social Drivers of Health   Financial Resource Strain: Low Risk  (02/10/2024)   Overall Financial Resource Strain (CARDIA)    Difficulty of Paying Living Expenses: Not hard at all  Food Insecurity: No Food Insecurity (02/10/2024)   Hunger Vital Sign    Worried About Running Out of Food in the Last Year: Never true    Ran Out of Food in the Last Year: Never true  Transportation Needs: No Transportation Needs (02/10/2024)   PRAPARE - Administrator, Civil Service (Medical): No    Lack of Transportation (Non-Medical): No  Physical Activity: Insufficiently Active (02/10/2024)   Exercise Vital Sign    Days of Exercise per Week: 5 days    Minutes of Exercise per Session: 10 min  Stress: No Stress Concern Present (02/10/2024)   [employer] of Occupational Health - Occupational Stress Questionnaire    Feeling of Stress : Not at all  Social Connections: Socially Integrated (02/10/2024)   Social Connection and Isolation Panel [NHANES]    Frequency of Communication with Friends and Family: More than three times a week    Frequency of Social Gatherings with Friends and Family: Twice a week    Attends Religious Services: More than 4 times per year    Active Member of [employer] or Organizations: Yes    Attends Engineer, structural: More than 4 times per year    Marital Status: Married  Catering manager Violence: Not At Risk (08/13/2023)   Humiliation, Afraid, Rape, and Kick questionnaire    Fear of Current or Ex-Partner: No    Emotionally Abused: No    Physically Abused: No    Sexually Abused: No    Review of Systems  Constitutional:  Negative for fatigue and unexpected weight change.  Eyes:  Negative for visual disturbance.  Respiratory:  Negative for cough, chest tightness and shortness of breath.    Cardiovascular:  Negative for chest pain, palpitations and leg swelling.  Gastrointestinal:  Negative for abdominal  pain and blood in stool.  Neurological:  Negative for dizziness, light-headedness and headaches.     Objective:   Vitals:   02/11/24 0821  BP: 130/76  Pulse: 82  Temp: 97.9 F (36.6 C)  TempSrc: Temporal  SpO2: 97%  Weight: (!) 331 lb 6.4 oz (150.3 kg)  Height: 5\' 11"  (1.803 m)     Physical Exam Vitals reviewed.  Constitutional:      Appearance: He is well-developed. He is obese.  HENT:     Head: Normocephalic and atraumatic.  Neck:     Vascular: No carotid bruit or JVD.  Cardiovascular:     Rate and Rhythm: Normal rate and regular rhythm.     Heart sounds: Normal heart sounds. No murmur heard. Pulmonary:     Effort: Pulmonary effort is normal.     Breath sounds: Normal breath sounds. No rales.  Musculoskeletal:     Right lower leg: No edema.     Left lower leg: No edema.  Skin:    General: Skin is warm and dry.  Neurological:     Mental Status: He is alert and oriented to person, place, and time.  Psychiatric:        Mood and Affect: Mood normal.        Assessment & Plan:  Bruce Williams is a 73 y.o. male . Coronary artery disease involving native heart without angina pectoris, unspecified vessel or lesion type - Plan: bisoprolol (ZEBETA) 5 MG tablet  -Tolerating current med regimen, asymptomatic, continue follow-up with cardiology as above including for updated stress testing.  Continue bisoprolol same dose for now.  Essential hypertension  -As above, hypertension stable.  Blood pressure stable in office.  Hyperlipidemia, unspecified hyperlipidemia type - Plan: Comprehensive metabolic panel, Lipid panel  -Check lipids, and then discussed possible medication adjustments as above.  Potential additional dose of statin versus Repatha through cardiology.  OSA on CPAP  -Stable, compliant, continue follow-up with sleep specialist as  above.  Other polyneuropathy - Plan: gabapentin (NEURONTIN) 100 MG capsule  -Currently 300 mg dose in the morning with breakthrough symptoms.  Trial of 100 mg in the afternoon and evening, then increase dose by 100 mg/week for 1 of those doses as needed for symptom control but watch for any new side effects, discussed in office.  RTC precautions given.   Meds ordered this encounter  Medications   gabapentin (NEURONTIN) 100 MG capsule    Sig: Take 1-3 capsules (100-300 mg total) by mouth 3 (three) times daily as needed.    Dispense:  360 capsule    Refill:  1   bisoprolol (ZEBETA) 5 MG tablet    Sig: Take 1 tablet (5 mg total) by mouth daily.    Dispense:  90 tablet    Refill:  2   Patient Instructions  Try increasing the gabapentin to 300mg  in the morning, 100mg  in afternoon with lunch (pack with lunch), then 100mg  at bedtime. We can increase dosing weekly until better control of symptoms. Increase no more than 100mg  per dose per week. Follow up with neuro as planned.   Depending on labs we can discuss changes in cholesterol med as recommended by cardiology.   No change in bisoprolol for now.   Take care!    Signed,   Meredith Staggers, MD  Primary Care, [facility] 02/11/24 1:03 PM

## 2024-02-14 ENCOUNTER — Encounter: Payer: Self-pay | Admitting: Family Medicine

## 2024-04-15 ENCOUNTER — Encounter: Payer: Self-pay | Admitting: Cardiology

## 2024-05-01 ENCOUNTER — Other Ambulatory Visit: Payer: Self-pay | Admitting: Family Medicine

## 2024-05-01 DIAGNOSIS — I2510 Atherosclerotic heart disease of native coronary artery without angina pectoris: Secondary | ICD-10-CM

## 2024-05-01 DIAGNOSIS — E785 Hyperlipidemia, unspecified: Secondary | ICD-10-CM

## 2024-05-18 ENCOUNTER — Other Ambulatory Visit: Payer: Self-pay | Admitting: Adult Health

## 2024-05-18 DIAGNOSIS — I2510 Atherosclerotic heart disease of native coronary artery without angina pectoris: Secondary | ICD-10-CM

## 2024-05-26 ENCOUNTER — Telehealth (HOSPITAL_COMMUNITY): Payer: Self-pay | Admitting: *Deleted

## 2024-05-26 NOTE — Telephone Encounter (Signed)
 Left message on voicemail per DPR in reference to upcoming appointment scheduled on 05/31/2024 at 8:15 with detailed instructions given per Myocardial Perfusion Study Information Sheet for the test. LM to arrive 15 minutes early, and that it is imperative to arrive on time for appointment to keep from having the test rescheduled. If you need to cancel or reschedule your appointment, please call the office within 24 hours of your appointment. Failure to do so may result in a cancellation of your appointment, and a $50 no show fee. Phone number given for call back for any questions.

## 2024-05-31 ENCOUNTER — Ambulatory Visit (HOSPITAL_COMMUNITY)
Admission: RE | Admit: 2024-05-31 | Discharge: 2024-05-31 | Disposition: A | Discharge status: Home / Self Care | Payer: Medicare Other | Source: Ambulatory Visit | Attending: Adult Health | Admitting: Adult Health

## 2024-05-31 DIAGNOSIS — I2510 Atherosclerotic heart disease of native coronary artery without angina pectoris: Secondary | ICD-10-CM | POA: Insufficient documentation

## 2024-05-31 LAB — MYOCARDIAL PERFUSION IMAGING
LV dias vol: 166 mL (ref 62–150)
LV sys vol: 68 mL (ref 4.2–5.8)
Nuc Stress EF: 59 %
Peak HR: 80 {beats}/min
Rest HR: 62 {beats}/min
Rest Nuclear Isotope Dose: 12.7 mCi
SDS: 1
SRS: 13
SSS: 13
ST Depression (mm): 0 mm
Stress Nuclear Isotope Dose: 35.9 mCi
TID: 1.48

## 2024-05-31 MED ORDER — REGADENOSON 0.4 MG/5ML IV SOLN
0.4000 mg | Freq: Once | INTRAVENOUS | Status: AC
  Administered 2024-05-31: 0.4 mg via INTRAVENOUS

## 2024-05-31 MED ORDER — REGADENOSON 0.4 MG/5ML IV SOLN
INTRAVENOUS | Status: AC
  Filled 2024-05-31: qty 5

## 2024-05-31 MED ORDER — TECHNETIUM TC 99M TETROFOSMIN IV KIT
35.9000 | PACK | Freq: Once | INTRAVENOUS | Status: AC | PRN
  Administered 2024-05-31: 35.9 via INTRAVENOUS

## 2024-05-31 MED ORDER — TECHNETIUM TC 99M TETROFOSMIN IV KIT
12.7000 | PACK | Freq: Once | INTRAVENOUS | Status: AC | PRN
  Administered 2024-05-31: 12.7 via INTRAVENOUS

## 2024-06-01 ENCOUNTER — Inpatient Hospital Stay (HOSPITAL_COMMUNITY): Admission: RE | Admit: 2024-06-01 | Payer: Medicare Other | Source: Ambulatory Visit

## 2024-06-02 ENCOUNTER — Ambulatory Visit: Payer: Self-pay | Admitting: Adult Health

## 2024-06-07 NOTE — Progress Notes (Signed)
 Sent to patient via mychart.

## 2024-08-03 ENCOUNTER — Other Ambulatory Visit: Payer: Self-pay | Admitting: Family Medicine

## 2024-08-03 DIAGNOSIS — E785 Hyperlipidemia, unspecified: Secondary | ICD-10-CM

## 2024-08-03 DIAGNOSIS — I2510 Atherosclerotic heart disease of native coronary artery without angina pectoris: Secondary | ICD-10-CM

## 2024-08-09 NOTE — Progress Notes (Unsigned)
 SABRA

## 2024-08-09 NOTE — Progress Notes (Unsigned)
 PATIENT: Bruce Williams DOB: 10-15-1951  REASON FOR VISIT: follow up HISTORY FROM: patient  No chief complaint on file.    HISTORY OF PRESENT ILLNESS:  08/09/24 ALL:  Bruce Williams for follow up for OSA on CPAP and peripheral neuropathy.     08/19/2023 ALL:  Bruce Williams for follow up for OSA on CPAP and peripheral neuropathy. He continues gabapentin  300mg  daily. Neuropathy is stable.   He continues to do well on therapy. He is using CPAP nightly for about 6-7 hours, on average. He denies concerns with machine or supplies. He is using a nasal mask.      08/07/2022 ALL: Bruce Williams for follow up for OSA on CPAP and recently diagnosed peripheral neuropathy.   He is doing well on CPAP. He likes his new machine. He is using therapy nightly. He is using nasal pillow. He does not note air leaking.   He was started on gabapentin  100mg  up to three times daily. He took ALA for about a month and felt that it was not effective and was too expensive. He continues gabapentin  100mg  three times daily but considering taking 300mg  at bedtime.     01/31/2022 CD: Bruce Williams is a 73 y.o. male patient, seen here in a referral from Dr. Levora for referral for foot numbness and lower back pain radiating down R leg.  Radiating outside of the leg into the toes. Across dorsum pedis between hallux and second toe.    Requesting nerve conduction testing to determine neuropathy cause. Neck pain and burning at base of neck. No recent injuries.    He is not a drinker, vapes , former smoker.    TSH, Vit B 12 , lipids, electrolytes and CMET were normal 09-2021. He reports arthritis pain, takes ibuprofen.   08/06/2021 ALL: Bruce Williams is a 73 y.o. male here today for follow up for OSA on CPAP.  He received new CPAP 05/2021. He is doing well. He reports nightly use. He does note air leak when sleeping on his side. He has tried multiple mask and most comfortable with medium nasal pillow. He is frustrated with  DME as he report last three months of data is not available to him on [facility]. We are able to pull most recent data. 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      HISTORY: (copied from Dr Dohmeier's previous note)  HPI:  Bruce Williams is a 73 y.o. male patient, seen here in a referral from Dr. Levora for a sleep apnea evaluation-  I have the pleasure of meeting Bruce Williams last time in September 2019 at this time he already was a CPAP user and old the current machine since 2016.  He has been 100% compliant CPAP user 6 hours 41 minutes on average his minimum pressure setting is 5 maximum pressure setting 17 cmH2O with a 3 cm expiratory pressure relief.  His 95th percentile pressure is 14.3 cm so he is well within his current settings.  The residual AHI is 2.2 and there are no Cheyne-Stokes respirations measured.  A CPAP compliance report is 100% compliant CPAP compliance reports do not show the current baseline of apnea.  Hypoxemia.  His BMI has been 45 and has been in that range for a long time.  So I like for him to write a new prescription for his supplies, I also would order at least a home sleep test to have a new baseline and based on his current  apnea count I would set the new machine probably similar to the old one.  The patient is okay with this approach.    08-04-2018 He has been a DOT driver -  [facility]- now [facility] patient , with known OSA and CPAP treatment.  Bruce Williams is a 73 year old married Caucasian right-handed patient who is a Electronics engineer and specialized in aluminum. He was first diagnosed in 2002 at [place]  at [facility] in hospital sleep lab.  He learned that he had sleep apnea and was treated with CPAP ever since.  Once he moved to [place]  he continued treatment here at [facility].  He has been using an AutoSet air sense 10 machine, and this was just issued to him in May 2016.  It is set between 5 and 15 cmH2O with 3 cm EPR, he is  using on average 6 hours and 35 minutes of CPAP therapy nightly, 100% compliance by download and his residual AHI is 1.8.  He does however have high air leakage, the 95th percentile pressure is close to 14 cm which would mean that he could use a little bit higher pressure settings on his AutoSet.  He feels clinically that his CPAP therapy is sufficient he has not endorsed elevated sleepiness levels on the Epworth sleepiness score.  Today he endorses at 4 points, fatigue severity at 23 points, and the geriatric depression score was endorsed at 1 out of 15 points.   DOT related 90-day download shows equally 100% of use at time 99% by time over 4 hours, only one night to use the machine for 3-1/2 hours.  Average use of time 6 hours 26 minutes, same settings as above, residual AHI 2.0 with obstructive apneas being 0.5.  Similar air leak.   Chief complaint according to patient :He needs to transfer care and wanted to be in the [facility].  Bruce Williams has been a compliant CPAP user for well over 15 years, and he has been followed at [facility] in [facility] for the last 3 or 5 years.  That needs to be no change in settings, his current machine is 72.71 years old but I like for him to have a new nasal pillow fitted.  The ResMed air fit P 10 is very easily sliding off and he may do better with an N 30 I. I will follow him yearly until he needs a new machine.    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  He works about 10 to 12 hours daily in his retirement job.  He just transitioned to Medicare.  Occasional he will spend the evening at church functions, the couple goes bowling, movies goes to the movies but he is not necessarily physically active. His bedtime is usually between 10 and 11 PM unless he has a very early morning assignment.  He usually has no trouble to initiate sleep, on occasion he will be early  in bed because of the early morning assignments, but he usually sleeps well in a cool, quiet and dark bedroom.  He does not have nocturia.  He sleeps on either side, mostly mostly the left. He sleeps on only one pillow for head support in a nonadjustable bed.  He rises in the morning usually at 6 AM or 7 AM, and he usually feels rested at the time.  He averages 7 to 8 hours of nocturnal  sleep, most nights uninterrupted.   I have the pleasure of reviewing Mr. Minor Marland's last sleep study which was a home sleep test performed at [facility] on [address] in [place]  on Mar 30, 2015 by Dr. at bedtime Mohammed.  The AHI was 57.6, supine sleep position accentuated the AHI to 59.3, the majority was obstructive 28.5/h followed by centrals at 14.4/h.  Oxygen saturation low was 79%, duration of 31 minutes at or below 88%.  1 hour 49 minutes.  Pulse rate between 47 and 94 bpm apparently no arrhythmia was captured.  He was issued the auto set air sense 10 machine after this test.  I also have a pulmonology note available obstructive sleep apnea ordering AutoPap 5 through 15 cmH2O, risk factor BMI 45-49.9 in adult.  Based on these notes I should be able to transfer his CPAP care and supplies to a local DME.   Sleep medical history and family sleep history: CAD, MI in 1998 and stent [place] in [place]. - reports ongoing  irregular heart beats.  OSA affected his father, another air force veteran. No sleep walking, enuresis , no night terrors.    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 The patient is married, has 3 adult children ( oldest 75 year old daughter with 3 children, 2 step children) , 5 grandchildren- former 66 year smoker- quit 5 years ago in 2014. Beer / wine - one glass a week. Caffeine ; 2 cups of coffee a day- no sodas, not often iced tea.    REVIEW OF SYSTEMS: Out of a complete 14 system review of symptoms, the patient complains only of the  following symptoms, burning pain in feet and all other reviewed systems are negative.  ESS: 3/24, previously 2  ALLERGIES: Allergies  Allergen Reactions   Zetia  [Ezetimibe ] Other (See Comments)    Muscle aches and joint pain - stopped    HOME MEDICATIONS: Outpatient Medications Prior to Visit  Medication Sig Dispense Refill   aspirin EC 81 MG tablet Take 81 mg by mouth daily.     bisoprolol  (ZEBETA ) 5 MG tablet Take 1 tablet (5 mg total) by mouth daily. 90 tablet 2   co-enzyme Q-10 30 MG capsule Take 30 mg by mouth 3 (three) times daily.     gabapentin  (NEURONTIN ) 100 MG capsule Take 1-3 capsules (100-300 mg total) by mouth 3 (three) times daily as needed. 360 capsule 1   ketoconazole  (NIZORAL ) 200 MG tablet Take 2 tablets (400 mg total) by mouth daily. 2 tablet 1   Krill Oil 300 MG CAPS Take by mouth.     Multiple Vitamin (MULTI-VITAMINS) TABS Take by mouth.     Probiotic Product (PROBIOTIC MATURE ADULT PO) Take 94 mg by mouth.     rosuvastatin  (CRESTOR ) 20 MG tablet Take 1 tablet by mouth once daily 90 tablet 0   triamcinolone  cream (KENALOG ) 0.1 % Apply to patches of dry skin 2 times daily as needed 30 g 1   TURMERIC PO Take by mouth.     No facility-administered medications prior to visit.    PAST MEDICAL HISTORY: Past Medical History:  Diagnosis Date   Coronary artery disease involving native heart without angina pectoris 1998   No angina since MI in 1998 - Non-ischemic Stress Echo 08/2017   History of acute Inferior-Posterior wall MI 1998   ([place]) - BMS PCI RCA (inferoposterior Akinesis on Echo) & Large Sized - Severe Intesity Fixed Perfusion Defect  in Basal-mid-apical Inferior-inferolateral & apical lateral wall c/w prior Infarct (no peri-infarct ischemia)   Hyperlipidemia with target low density lipoprotein (LDL) cholesterol less than 70 mg/dL    OSA on CPAP    Sleep apnea    Phreesia 02/06/2021    PAST SURGICAL HISTORY: Past Surgical History:  Procedure  Laterality Date   ANKLE FRACTURE SURGERY Right 1980's   CORONARY/GRAFT ACUTE MI REVASCULARIZATION  1990   BMS PCI to RCA (in [place] ) in setting of inferoposterior STEMI   EXERCISE TOLERANCE TEST  08/01/2020   Exercised 3:36 minutes-  Peak HR 137 bpm, peak BP 225/77 mmHg.  MPHR 153 bpm-achieved 89%.  5.3 METS.  Nondiagnostic EKG stress test for ischemia due to resting ST or T wave abnormalities.  Poor functional capacity, with hypertensive response to exercise.  Read As Intermediate Risk   NM MYOVIEW  LTD  08/30/2020   EF 45 to 50% with mild diffuse HK.  Large size/severe defect in the basal-mid-apical inferior-inferolateral and apical lateral wall.  Consistent with prior MI in the inferolateral distribution.  No reversibility.  Infarct with no ischemia. => Intermediate Risk because of old infarct and mildly reduced EF, but no evidence of ischemia.   TREADMILL STRESS ECHO  09/17/2017   Pre-stress normal LV function at rest but no inferior posterior akinesis..  Exercised for: 35 min.  7.4 METS --> fair functional capacity.  Inferior posterior wall akinesis at rest consistent with prior MI --> maintained inferior posterior akinesis with exertion.  No evidence of ischemia.    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     He lives with his current wife of 9 years.   R handed   Caffeine: 2 C of coffee a day   Drives a truck having CDL license.  [place] radius based out of [place]   Social Drivers of Health   Financial Resource Strain: Low Risk  (02/10/2024)   Overall Financial Resource Strain (CARDIA)    Difficulty of Paying Living Expenses: Not hard at all  Food Insecurity: No Food Insecurity (02/10/2024)   Hunger Vital Sign    Worried About Running Out of Food in the Last Year: Never true    Ran Out of Food in the Last Year: Never true  Transportation Needs: No Transportation Needs (02/10/2024)   PRAPARE - Administrator, Civil Service (Medical): No    Lack of Transportation (  Non-Medical): No  Physical Activity: Insufficiently Active (02/10/2024)   Exercise Vital Sign    Days of Exercise per Week: 5 days    Minutes of Exercise per Session: 10 min  Stress: No Stress Concern Present (02/10/2024)   [employer] of Occupational Health - Occupational Stress Questionnaire    Feeling of Stress : Not at all  Social Connections: Socially Integrated (02/10/2024)   Social Connection and Isolation Panel    Frequency of Communication with Friends and Family: More than three times a week    Frequency of Social Gatherings with Friends and Family: Twice a week    Attends Religious Services: More than 4 times per year    Active Member of [employer] or Organizations: Yes    Attends Engineer, structural: More than 4 times per year    Marital Status: Married  Catering manager Violence: Not At Risk (08/13/2023)   Humiliation, Afraid, Rape, and Kick questionnaire    Fear of Current or Ex-Partner: No    Emotionally Abused: No    Physically Abused: No    Sexually Abused: No      PHYSICAL EXAM  There were no vitals filed for this visit.    There is no height or weight on file to calculate BMI.  Generalized: Well developed, in no acute distress  Cardiology: normal rate and rhythm, no murmur noted Respiratory: clear to auscultation bilaterally  Neurological examination  Mentation: Alert oriented to time, place, history taking. Follows all commands speech and language fluent Cranial nerve II-XII: Pupils were equal round reactive to light. Extraocular movements were full, visual field were full  Motor: The motor testing reveals 5 over 5 strength of all 4 extremities. Good symmetric motor tone is noted throughout.  Gait and station: Gait is normal.    DIAGNOSTIC DATA (LABS, IMAGING, TESTING) - I reviewed patient records, labs, notes, testing and imaging myself where available.      No data to display           No results found for: WBC, HGB, HCT, MCV, PLT    Component Value Date/Time   NA 139 02/11/2024 0913   NA 140 03/08/2021 1006   K 4.7 02/11/2024 0913   CL 105 02/11/2024 0913   CO2 27 02/11/2024 0913   GLUCOSE 101 (H) 02/11/2024 0913   BUN 17 02/11/2024 0913   BUN 19 03/08/2021 1006   CREATININE 0.95 02/11/2024 0913   CALCIUM  9.6 02/11/2024 0913   PROT 7.9 02/11/2024 0913   PROT 7.0 03/08/2021 1006   ALBUMIN 4.3 02/11/2024 0913   ALBUMIN 4.2 03/08/2021 1006   AST 15 02/11/2024 0913   ALT 13 02/11/2024 0913   ALKPHOS 40 02/11/2024 0913   BILITOT 0.4 02/11/2024 0913   BILITOT 0.4 03/08/2021 1006   GFRNONAA 85 06/28/2020 0808   GFRAA 98 06/28/2020 0808   Lab Results  Component Value Date   CHOL 130 02/11/2024   HDL 57.80 02/11/2024   LDLCALC 64 02/11/2024   TRIG 43.0 02/11/2024   CHOLHDL 2 02/11/2024   No results found for: HGBA1C Lab Results  Component Value Date   VITAMINB12 421 10/22/2021   Lab Results  Component Value Date   TSH 1.86 10/22/2021     ASSESSMENT AND PLAN 73 y.o. year old male  has a  past medical history of Coronary artery disease involving native heart without angina pectoris (1998), History of acute Inferior-Posterior wall MI (1998), Hyperlipidemia with target low density lipoprotein (LDL) cholesterol less than 70  mg/dL, OSA on CPAP, and Sleep apnea. here with   No diagnosis found.   Bruce Williams is doing well on CPAP therapy. Compliance report reveals excellent compliance. He was encouraged to continue using CPAP nightly and for greater than 4 hours each night. He will continue to monitor for elevated leak at home. AHI is well managed. We will update supply orders as indicated. Risks of untreated sleep apnea review and education materials provided. He will continue gabapentin  up to 300mg  daily. May add ALA if needed. Healthy lifestyle habits encouraged. He will follow up in 1 year, sooner if needed. He verbalizes understanding and agreement with this plan.   Last CPAP set up date 05/2021.    No orders of the defined types were placed in this encounter.     No orders of the defined types were placed in this encounter.    Greig Forbes, FNP-C 08/09/2024, 8:36 AM [facility] [address] [place], [place] [address] [phone]

## 2024-08-09 NOTE — Patient Instructions (Incomplete)
 Please continue using your CPAP regularly. While your insurance requires that you use CPAP at least 4 hours each night on 70% of the nights, I recommend, that you not skip any nights and use it throughout the night if you can. Getting used to CPAP and staying with the treatment long term does take time and patience and discipline. Untreated obstructive sleep apnea when it is moderate to severe can have an adverse impact on cardiovascular health and raise her risk for heart disease, arrhythmias, hypertension, congestive heart failure, stroke and diabetes. Untreated obstructive sleep apnea causes sleep disruption, nonrestorative sleep, and sleep deprivation. This can have an impact on your day to day functioning and cause daytime sleepiness and impairment of cognitive function, memory loss, mood disturbance, and problems focussing. Using CPAP regularly can improve these symptoms.  We will update supply orders, today. Continue gabapentin  per Dr Levora.   Follow up in 1 year

## 2024-08-10 ENCOUNTER — Ambulatory Visit (INDEPENDENT_AMBULATORY_CARE_PROVIDER_SITE_OTHER): Payer: Medicare Other | Admitting: Family Medicine

## 2024-08-10 ENCOUNTER — Encounter: Payer: Self-pay | Admitting: Family Medicine

## 2024-08-10 VITALS — BP 124/78 | HR 73 | Ht 71.0 in | Wt 328.4 lb

## 2024-08-10 DIAGNOSIS — G4733 Obstructive sleep apnea (adult) (pediatric): Secondary | ICD-10-CM

## 2024-08-10 DIAGNOSIS — M792 Neuralgia and neuritis, unspecified: Secondary | ICD-10-CM | POA: Diagnosis not present

## 2024-08-13 ENCOUNTER — Ambulatory Visit (INDEPENDENT_AMBULATORY_CARE_PROVIDER_SITE_OTHER): Admitting: Family Medicine

## 2024-08-13 VITALS — BP 122/76 | HR 69 | Temp 98.0°F | Ht 71.0 in | Wt 323.0 lb

## 2024-08-13 DIAGNOSIS — I10 Essential (primary) hypertension: Secondary | ICD-10-CM

## 2024-08-13 DIAGNOSIS — G6289 Other specified polyneuropathies: Secondary | ICD-10-CM

## 2024-08-13 DIAGNOSIS — Z131 Encounter for screening for diabetes mellitus: Secondary | ICD-10-CM | POA: Diagnosis not present

## 2024-08-13 DIAGNOSIS — E785 Hyperlipidemia, unspecified: Secondary | ICD-10-CM | POA: Diagnosis not present

## 2024-08-13 DIAGNOSIS — I2510 Atherosclerotic heart disease of native coronary artery without angina pectoris: Secondary | ICD-10-CM | POA: Diagnosis not present

## 2024-08-13 DIAGNOSIS — G4733 Obstructive sleep apnea (adult) (pediatric): Secondary | ICD-10-CM

## 2024-08-13 LAB — HEMOGLOBIN A1C: Hgb A1c MFr Bld: 6.3 % (ref 4.6–6.5)

## 2024-08-13 LAB — COMPREHENSIVE METABOLIC PANEL WITH GFR
ALT: 13 U/L (ref 0–53)
AST: 15 U/L (ref 0–37)
Albumin: 4.2 g/dL (ref 3.5–5.2)
Alkaline Phosphatase: 39 U/L (ref 39–117)
BUN: 18 mg/dL (ref 6–23)
CO2: 28 meq/L (ref 19–32)
Calcium: 9.7 mg/dL (ref 8.4–10.5)
Chloride: 105 meq/L (ref 96–112)
Creatinine, Ser: 1.03 mg/dL (ref 0.40–1.50)
GFR: 73.1 mL/min (ref >60.00)
Glucose, Bld: 90 mg/dL (ref 70–99)
Potassium: 4.7 meq/L (ref 3.5–5.1)
Sodium: 139 meq/L (ref 135–145)
Total Bilirubin: 0.5 mg/dL (ref 0.2–1.2)
Total Protein: 7.7 g/dL (ref 6.0–8.3)

## 2024-08-13 LAB — LIPID PANEL
Cholesterol: 121 mg/dL (ref 0–200)
HDL: 49.1 mg/dL (ref >39.00)
LDL Cholesterol: 60 mg/dL (ref 0–99)
NonHDL: 71.66
Total CHOL/HDL Ratio: 2
Triglycerides: 58 mg/dL (ref 0.0–149.0)
VLDL: 11.6 mg/dL (ref 0.0–40.0)

## 2024-08-13 MED ORDER — BISOPROLOL FUMARATE 5 MG PO TABS: 5.0000 mg | ORAL_TABLET | Freq: Every day | ORAL | 2 refills | Status: AC

## 2024-08-13 MED ORDER — ROSUVASTATIN CALCIUM 20 MG PO TABS: 20.0000 mg | ORAL_TABLET | Freq: Every day | ORAL | 0 refills | Status: AC

## 2024-08-13 MED ORDER — GABAPENTIN 100 MG PO CAPS: 100.0000 mg | ORAL_CAPSULE | Freq: Three times a day (TID) | ORAL | 1 refills | Status: AC | PRN

## 2024-08-13 NOTE — Patient Instructions (Addendum)
 Thank you for coming in today. No change in medications at this time. If there are any concerns on your bloodwork, I will let you know. Take care!

## 2024-08-13 NOTE — Progress Notes (Addendum)
 Subjective:  Patient ID: Bruce Williams, male    DOB: 05-10-1951  Age: 73 y.o. MRN: [id number]  CC:  Chief Complaint  Patient presents with   Follow-up    No other concerns    HPI Bruce Williams presents for    Hyperlipidemia: With history of CAD, remote inferior posterior wall MI, and OSA on CPAP.  Intolerant to Zetia  previously with muscle aches and pains, has been treated with Crestor  20 mg daily.  Nexletol  was cost prohibitive.  Goal LDL under 50.  Last lipids in March. No new side effects.  Still on Krill oil.  Visit with sleep specialist on September 16, Excellent compliance.  Continued CPAP.  AHI well-managed. Stress testing July 7.  No new areas of concern.  Prior defect noted, but no new areas.  EF 59%.  Low risk study.  Perfusion defect unchanged.  No results found for: HGBA1C  Lab Results  Component Value Date   CHOL 130 02/11/2024   HDL 57.80 02/11/2024   LDLCALC 64 02/11/2024   TRIG 43.0 02/11/2024   CHOLHDL 2 02/11/2024   Lab Results  Component Value Date   ALT 13 02/11/2024   AST 15 02/11/2024   ALKPHOS 40 02/11/2024   BILITOT 0.4 02/11/2024   Hypertension: With OSA on CPAP as above with recent visit with sleep specialist.  Hypertension treated with bisoprolol  5 mg daily.  No new side effects. 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  Possible lumbar cause of neuropathy.  Neuropathic pain managed with gabapentin  200mg  BID. Option of higher doses have been discussed. OTC supplement has been helpful past 30 days.   Obesity Has been avoiding fast food, sugar containing beverages.  Walking few days per week. Weight has improved since last visit. Wt Readings from Last 3 Encounters:   08/13/24 (!) 323 lb (146.5 kg)  08/10/24 (!) 328 lb 6.4 oz (149 kg)  02/11/24 (!) 331 lb 6.4 oz (150.3 kg)    Declines flu vaccine.   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/16/2021   OSA on CPAP 03/08/2021   Abnormal stress electrocardiogram test using treadmill 08/07/2020   Irregular heart beats 08/31/2018   Encounter for physical examination related to employment 08/31/2018   Coronary artery disease involving native heart without angina pectoris    Hyperlipidemia with target LDL less than 70    Acute bilateral low back pain 09/23/2017   Intrinsic eczema 07/21/2017   Tinea versicolor 07/21/2017   Chronic pain of right ankle 03/28/2017   Normocytic anemia 03/28/2017   Old myocardial infarction 03/28/2017   Bilateral hand pain 03/07/2016   Morbid obesity with body mass index (BMI) of 45.0 to 49.9 in adult (HCC) 03/07/2016   PSVT (paroxysmal supraventricular tachycardia) (HCC) 03/07/2016   Coronary artery disease 03/07/2016   Hyperlipidemia 03/07/2016   Past Medical History:  Diagnosis Date   Coronary artery disease involving native heart without angina pectoris 1998   No angina since MI in 1998 - Non-ischemic Stress Echo 08/2017   History of acute Inferior-Posterior wall MI 1998   [place] - BMS PCI RCA (inferoposterior Akinesis on Echo) & Large Sized - Severe  Intesity Fixed Perfusion Defect in Basal-mid-apical Inferior-inferolateral & apical lateral wall c/w prior Infarct (no peri-infarct ischemia)   Hyperlipidemia with target low density lipoprotein (LDL) cholesterol less than 70 mg/dL    OSA on CPAP    Sleep apnea    [employer]rees[employer] 02/06/2021   Past Surgical History:  Procedure Laterality Date   ANKLE FRACTURE SURGERY Right 1980's   CORONARY/GRAFT ACUTE MI REVASCULARIZATION  1990   BMS PCI to RCA (in [place] ) in setting of inferoposterior STEMI   EXERCISE TOLERANCE TEST   08/01/2020   Exercised 3:36 minutes-  Peak HR 137 bpm, peak BP 225/77 mmHg.  MPHR 153 bpm-achieved 89%.  5.3 METS.  Nondiagnostic EKG stress test for ischemia due to resting ST or T wave abnormalities.  Poor functional capacity, with hypertensive response to exercise.  Read As Intermediate Risk   NM MYOVIEW  LTD  08/30/2020   EF 45 to 50% with mild diffuse HK.  Large size/severe defect in the basal-mid-apical inferior-inferolateral and apical lateral wall.  Consistent with prior MI in the inferolateral distribution.  No reversibility.  Infarct with no ischemia. => Intermediate Risk because of old infarct and mildly reduced EF, but no evidence of ischemia.   TREADMILL STRESS ECHO  09/17/2017   Pre-stress normal LV function at rest but no inferior posterior akinesis..  Exercised for: 35 min.  7.4 METS --> fair functional capacity.  Inferior posterior wall akinesis at rest consistent with prior MI --> maintained inferior posterior akinesis with exertion.  No evidence of ischemia.   Allergies  Allergen Reactions   Zetia  [Ezetimibe ] Other (See Comments)    Muscle aches and joint pain - stopped   Prior to Admission medications   Medication Sig Start Date End Date Taking? Authorizing Provider  aspirin EC 81 MG tablet Take 81 mg by mouth daily.   Yes [provider]  bisoprolol  (ZEBETA ) 5 MG tablet Take 1 tablet (5 mg total) by mouth daily. 02/11/24  Yes Levora Reyes SAUNDERS, MD  co-enzyme Q-10 30 MG capsule Take 30 mg by mouth 3 (three) times daily.   Yes [provider]  gabapentin  (NEURONTIN ) 100 MG capsule Take 1-3 capsules (100-300 mg total) by mouth 3 (three) times daily as needed. 02/11/24  Yes Levora Reyes SAUNDERS, MD  ketoconazole  (NIZORAL ) 200 MG tablet Take 2 tablets (400 mg total) by mouth daily. 07/20/21  Yes Levora Reyes SAUNDERS, MD  Krill Oil 300 MG CAPS Take by mouth.   Yes [provider]  Multiple Vitamin (MULTI-VITAMINS) TABS Take by mouth.   Yes [provider]   Probiotic Product (PROBIOTIC MATURE ADULT PO) Take 94 mg by mouth.   Yes [provider]  rosuvastatin  (CRESTOR ) 20 MG tablet Take 1 tablet by mouth once daily 08/03/24  Yes Levora Reyes SAUNDERS, MD  triamcinolone  cream (KENALOG ) 0.1 % Apply to patches of dry skin 2 times daily as needed 12/16/22  Yes Levora Reyes SAUNDERS, MD  TURMERIC PO Take by mouth.   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     He lives with his current wife of 9 years.   R handed   Caffeine: 2 C of coffee a day   Drives a truck having CDL license.  [place] radius based out of [place]   Social Drivers of Health   Financial Resource Strain: Low Risk  (08/13/2024)   Overall Financial Resource Strain (CARDIA)    Difficulty of Paying Living Expenses: Not hard at all  Food Insecurity: No Food Insecurity (08/13/2024)   Hunger Vital Sign    Worried About Running Out of Food in the Last Year: Never true    Ran Out of Food in the Last Year: Never true  Transportation Needs: No Transportation Needs (08/13/2024)   PRAPARE - Administrator, Civil Service (Medical): No    Lack of Transportation (Non-Medical): No  Physical Activity:  Insufficiently Active (08/13/2024)   Exercise Vital Sign    Days of Exercise per Week: 5 days    Minutes of Exercise per Session: 20 min  Stress: No Stress Concern Present (08/13/2024)   [employer] of Occupational Health - Occupational Stress Questionnaire    Feeling of Stress: Only a little  Social Connections: Unknown (08/13/2024)   Social Connection and Isolation Panel    Frequency of Communication with Friends and Family: More than three times a week    Frequency of Social Gatherings with Friends and Family: Three times a week    Attends Religious Services: More than 4 times per year    Active Member of Clubs or Organizations: Yes    Attends Banker Meetings: More than 4 times per year    Marital Status: Not on file  Intimate Partner Violence: Not At Risk (08/13/2023)   Humiliation, Afraid, Rape, and Kick questionnaire    Fear of Current or Ex-Partner: No    Emotionally Abused: No    Physically Abused: No    Sexually Abused: No    Review of Systems  Constitutional:  Negative for fatigue and unexpected weight change.  Eyes:  Negative for visual disturbance.  Respiratory:  Negative for cough, chest tightness and shortness of breath.   Cardiovascular:  Negative for chest pain, palpitations and leg swelling.  Gastrointestinal:  Negative for abdominal pain and blood in stool.  Neurological:  Negative for dizziness, light-headedness and headaches.     Objective:   Vitals:   08/13/24 0834  BP: 122/76  Pulse: 69  Temp: 98 F (36.7 C)  SpO2: 96%  Weight: (!) 323 lb (146.5 kg)  Height: 5' 11 (1.803 m)     Physical Exam Vitals reviewed.  Constitutional:      Appearance: He is well-developed.  HENT:     Head: Normocephalic and atraumatic.  Neck:     Vascular: No carotid bruit or JVD.  Cardiovascular:     Rate and Rhythm: Normal rate and regular rhythm.     Heart sounds: Normal heart sounds. No murmur heard. Pulmonary:     Effort: Pulmonary effort is  normal.     Breath sounds: Normal  breath sounds. No rales.  Musculoskeletal:     Right lower leg: No edema.     Left lower leg: No edema.  Skin:    General: Skin is warm and dry.  Neurological:     Mental Status: He is alert and oriented to person, place, and time.  Psychiatric:        Mood and Affect: Mood normal.     Assessment & Plan:  Va Broadwell is a 73 y.o. male . Essential hypertension  Stable, tolerating current regimen. Medications refilled. Labs pending as above.   Coronary artery disease involving native heart without angina pectoris, unspecified vessel or lesion type - Plan: bisoprolol  (ZEBETA ) 5 MG tablet, rosuvastatin  (CRESTOR ) 20 MG tablet  - Asymptomatic, stable recent stress testing.  Continue statin, check labs and adjust plan accordingly.  Other polyneuropathy - Plan: gabapentin  (NEURONTIN ) 100 MG capsule  - Improved with over-the-counter supplement along with gabapentin .  Continue same.  Denies any new med side effects.  Hyperlipidemia, unspecified hyperlipidemia type - Plan: rosuvastatin  (CRESTOR ) 20 MG tablet, Comprehensive metabolic panel with GFR, Lipid panel  As above, check labs.  OSA on CPAP  - Compliant, good control of AHI on most recently on evaluation by sleep specialist.  No changes.  Screening for diabetes mellitus - Plan: Hemoglobin A1c Borderline glucose on most recent labs.  History of obesity, but weight has improved, commended on these changes.  Check A1c.  Meds ordered this encounter  Medications   bisoprolol  (ZEBETA ) 5 MG tablet    Sig: Take 1 tablet (5 mg total) by mouth daily.    Dispense:  90 tablet    Refill:  2   gabapentin  (NEURONTIN ) 100 MG capsule    Sig: Take 1-3 capsules (100-300 mg total) by mouth 3 (three) times daily as needed.    Dispense:  360 capsule    Refill:  1   rosuvastatin  (CRESTOR ) 20 MG tablet    Sig: Take 1 tablet (20 mg total) by mouth daily.    Dispense:  90 tablet    Refill:  0   Patient  Instructions  Thank you for coming in today. No change in medications at this time. If there are any concerns on your bloodwork, I will let you know. Take care!      Signed,   Reyes Pines, MD [facility], [facility] 08/13/24 9:16 AM

## 2024-08-15 ENCOUNTER — Ambulatory Visit: Payer: Self-pay | Admitting: Family Medicine

## 2025-02-14 ENCOUNTER — Encounter: Admitting: Family Medicine

## 2025-08-17 ENCOUNTER — Ambulatory Visit: Admitting: Family Medicine

## 2025-08-22 ENCOUNTER — Ambulatory Visit: Admitting: Family Medicine
